# Patient Record
Sex: Female | Born: 1935 | Race: White | Hispanic: No | Smoking: Former smoker
Health system: Southern US, Community
[De-identification: ages and names within clinical notes are randomized; demographics above are authoritative.]

## PROBLEM LIST (undated history)

## (undated) DIAGNOSIS — I639 Cerebral infarction, unspecified: Secondary | ICD-10-CM

## (undated) DIAGNOSIS — G8191 Hemiplegia, unspecified affecting right dominant side: Secondary | ICD-10-CM

## (undated) DIAGNOSIS — S2249XA Multiple fractures of ribs, unspecified side, initial encounter for closed fracture: Secondary | ICD-10-CM

## (undated) DIAGNOSIS — I4891 Unspecified atrial fibrillation: Secondary | ICD-10-CM

## (undated) DIAGNOSIS — J8410 Pulmonary fibrosis, unspecified: Secondary | ICD-10-CM

## (undated) DIAGNOSIS — I340 Nonrheumatic mitral (valve) insufficiency: Secondary | ICD-10-CM

## (undated) DIAGNOSIS — R0902 Hypoxemia: Secondary | ICD-10-CM

## (undated) DIAGNOSIS — J849 Interstitial pulmonary disease, unspecified: Secondary | ICD-10-CM

## (undated) DIAGNOSIS — E785 Hyperlipidemia, unspecified: Secondary | ICD-10-CM

## (undated) DIAGNOSIS — M1990 Unspecified osteoarthritis, unspecified site: Secondary | ICD-10-CM

---

## 2000-12-24 ENCOUNTER — Encounter: Payer: Self-pay | Admitting: Obstetrics and Gynecology

## 2000-12-24 ENCOUNTER — Ambulatory Visit (HOSPITAL_COMMUNITY): Admission: RE | Admit: 2000-12-24 | Discharge: 2000-12-24 | Payer: Self-pay | Admitting: Obstetrics and Gynecology

## 2001-01-08 ENCOUNTER — Other Ambulatory Visit: Admission: RE | Admit: 2001-01-08 | Discharge: 2001-01-08 | Payer: Self-pay | Admitting: Obstetrics and Gynecology

## 2001-06-06 ENCOUNTER — Ambulatory Visit (HOSPITAL_COMMUNITY): Admission: RE | Admit: 2001-06-06 | Discharge: 2001-06-06 | Payer: Self-pay | Admitting: Pulmonary Disease

## 2002-03-30 ENCOUNTER — Encounter: Payer: Self-pay | Admitting: Obstetrics and Gynecology

## 2002-03-30 ENCOUNTER — Ambulatory Visit (HOSPITAL_COMMUNITY): Admission: RE | Admit: 2002-03-30 | Discharge: 2002-03-30 | Payer: Self-pay | Admitting: Obstetrics and Gynecology

## 2004-02-25 ENCOUNTER — Ambulatory Visit (HOSPITAL_COMMUNITY): Admission: RE | Admit: 2004-02-25 | Discharge: 2004-02-25 | Payer: Self-pay | Admitting: Pulmonary Disease

## 2004-08-10 ENCOUNTER — Ambulatory Visit (HOSPITAL_COMMUNITY): Admission: RE | Admit: 2004-08-10 | Discharge: 2004-08-10 | Payer: Self-pay | Admitting: General Surgery

## 2004-08-14 ENCOUNTER — Ambulatory Visit (HOSPITAL_COMMUNITY): Admission: RE | Admit: 2004-08-14 | Discharge: 2004-08-14 | Payer: Self-pay | Admitting: General Surgery

## 2005-01-09 ENCOUNTER — Other Ambulatory Visit: Admission: RE | Admit: 2005-01-09 | Discharge: 2005-01-09 | Payer: Self-pay | Admitting: General Surgery

## 2005-02-26 ENCOUNTER — Ambulatory Visit (HOSPITAL_COMMUNITY): Admission: RE | Admit: 2005-02-26 | Discharge: 2005-02-26 | Payer: Self-pay | Admitting: General Surgery

## 2006-02-28 ENCOUNTER — Ambulatory Visit (HOSPITAL_COMMUNITY): Admission: RE | Admit: 2006-02-28 | Discharge: 2006-02-28 | Payer: Self-pay | Admitting: General Surgery

## 2006-04-17 ENCOUNTER — Ambulatory Visit: Payer: Self-pay | Admitting: Family Medicine

## 2006-04-19 ENCOUNTER — Ambulatory Visit (HOSPITAL_COMMUNITY): Admission: RE | Admit: 2006-04-19 | Discharge: 2006-04-19 | Payer: Self-pay | Admitting: Family Medicine

## 2006-04-25 ENCOUNTER — Encounter (INDEPENDENT_AMBULATORY_CARE_PROVIDER_SITE_OTHER): Payer: Self-pay | Admitting: *Deleted

## 2006-04-25 LAB — CONVERTED CEMR LAB
ALT: 14 units/L
Albumin: 3.9 g/dL
Bilirubin, Direct: 0.1 mg/dL
CO2: 29 meq/L
Cholesterol: 216 mg/dL
Glucose, Bld: 95 mg/dL
LDL Cholesterol: 136 mg/dL
Potassium: 4.7 meq/L
Sodium: 141 meq/L
Total Protein: 6.9 g/dL

## 2006-05-21 DIAGNOSIS — I4891 Unspecified atrial fibrillation: Secondary | ICD-10-CM

## 2006-05-21 DIAGNOSIS — I340 Nonrheumatic mitral (valve) insufficiency: Secondary | ICD-10-CM

## 2006-05-21 HISTORY — DX: Unspecified atrial fibrillation: I48.91

## 2006-05-21 HISTORY — PX: MITRAL VALVE REPAIR: SHX2039

## 2006-05-21 HISTORY — DX: Nonrheumatic mitral (valve) insufficiency: I34.0

## 2006-05-29 ENCOUNTER — Ambulatory Visit: Payer: Self-pay | Admitting: Family Medicine

## 2006-05-30 ENCOUNTER — Ambulatory Visit (HOSPITAL_COMMUNITY): Admission: RE | Admit: 2006-05-30 | Discharge: 2006-05-30 | Payer: Self-pay | Admitting: Family Medicine

## 2007-03-03 ENCOUNTER — Ambulatory Visit (HOSPITAL_COMMUNITY): Admission: RE | Admit: 2007-03-03 | Discharge: 2007-03-03 | Payer: Self-pay | Admitting: Family Medicine

## 2007-03-27 ENCOUNTER — Encounter: Payer: Self-pay | Admitting: Emergency Medicine

## 2007-03-28 ENCOUNTER — Encounter: Payer: Self-pay | Admitting: Cardiology

## 2007-03-28 ENCOUNTER — Inpatient Hospital Stay (HOSPITAL_COMMUNITY): Admission: EM | Admit: 2007-03-28 | Discharge: 2007-04-11 | Payer: Self-pay | Admitting: Cardiology

## 2007-03-28 ENCOUNTER — Ambulatory Visit: Payer: Self-pay | Admitting: Cardiovascular Disease

## 2007-04-01 ENCOUNTER — Encounter: Payer: Self-pay | Admitting: Cardiology

## 2007-04-01 ENCOUNTER — Ambulatory Visit: Payer: Self-pay | Admitting: Surgery

## 2007-04-02 ENCOUNTER — Encounter: Payer: Self-pay | Admitting: Cardiology

## 2007-04-02 ENCOUNTER — Ambulatory Visit: Payer: Self-pay | Admitting: Vascular Surgery

## 2007-04-04 ENCOUNTER — Encounter: Payer: Self-pay | Admitting: Cardiothoracic Surgery

## 2007-05-01 ENCOUNTER — Ambulatory Visit: Payer: Self-pay | Admitting: Cardiothoracic Surgery

## 2007-05-06 ENCOUNTER — Encounter (HOSPITAL_COMMUNITY): Admission: RE | Admit: 2007-05-06 | Discharge: 2007-05-21 | Payer: Self-pay | Admitting: Cardiology

## 2007-05-22 ENCOUNTER — Encounter: Payer: Self-pay | Admitting: Family Medicine

## 2007-05-23 ENCOUNTER — Encounter (HOSPITAL_COMMUNITY): Admission: RE | Admit: 2007-05-23 | Discharge: 2007-06-22 | Payer: Self-pay | Admitting: Cardiology

## 2007-06-23 ENCOUNTER — Encounter (HOSPITAL_COMMUNITY): Admission: RE | Admit: 2007-06-23 | Discharge: 2007-07-23 | Payer: Self-pay | Admitting: Cardiology

## 2007-07-24 ENCOUNTER — Encounter (HOSPITAL_COMMUNITY): Admission: RE | Admit: 2007-07-24 | Discharge: 2007-08-23 | Payer: Self-pay | Admitting: Cardiology

## 2007-08-25 ENCOUNTER — Encounter (HOSPITAL_COMMUNITY): Admission: RE | Admit: 2007-08-25 | Discharge: 2007-09-24 | Payer: Self-pay | Admitting: Cardiology

## 2007-09-11 ENCOUNTER — Ambulatory Visit: Payer: Self-pay | Admitting: Cardiothoracic Surgery

## 2007-12-05 DIAGNOSIS — M810 Age-related osteoporosis without current pathological fracture: Secondary | ICD-10-CM | POA: Insufficient documentation

## 2007-12-05 DIAGNOSIS — J209 Acute bronchitis, unspecified: Secondary | ICD-10-CM

## 2007-12-05 DIAGNOSIS — H409 Unspecified glaucoma: Secondary | ICD-10-CM | POA: Insufficient documentation

## 2008-03-04 ENCOUNTER — Ambulatory Visit (HOSPITAL_COMMUNITY): Admission: RE | Admit: 2008-03-04 | Discharge: 2008-03-04 | Payer: Self-pay | Admitting: General Surgery

## 2008-10-19 DIAGNOSIS — S2249XA Multiple fractures of ribs, unspecified side, initial encounter for closed fracture: Secondary | ICD-10-CM

## 2008-10-19 HISTORY — DX: Multiple fractures of ribs, unspecified side, initial encounter for closed fracture: S22.49XA

## 2008-10-29 ENCOUNTER — Observation Stay (HOSPITAL_COMMUNITY): Admission: EM | Admit: 2008-10-29 | Discharge: 2008-11-01 | Payer: Self-pay | Admitting: Emergency Medicine

## 2008-11-11 ENCOUNTER — Ambulatory Visit (HOSPITAL_COMMUNITY): Admission: RE | Admit: 2008-11-11 | Discharge: 2008-11-11 | Payer: Self-pay | Admitting: General Surgery

## 2008-12-13 ENCOUNTER — Ambulatory Visit (HOSPITAL_COMMUNITY): Admission: RE | Admit: 2008-12-13 | Discharge: 2008-12-13 | Payer: Self-pay | Admitting: Pulmonary Disease

## 2008-12-24 ENCOUNTER — Ambulatory Visit (HOSPITAL_COMMUNITY): Admission: RE | Admit: 2008-12-24 | Discharge: 2008-12-24 | Payer: Self-pay | Admitting: Pulmonary Disease

## 2009-01-08 IMAGING — CR DG CHEST 1V PORT
1 series · 1 of 1 positions shown · non-contrast
Comparison: 03/28/07.

CLINICAL DATA: Postop mitral valve replacement.
PORTABLE CHEST - 1 VIEW ([DATE]):

[view not recorded]
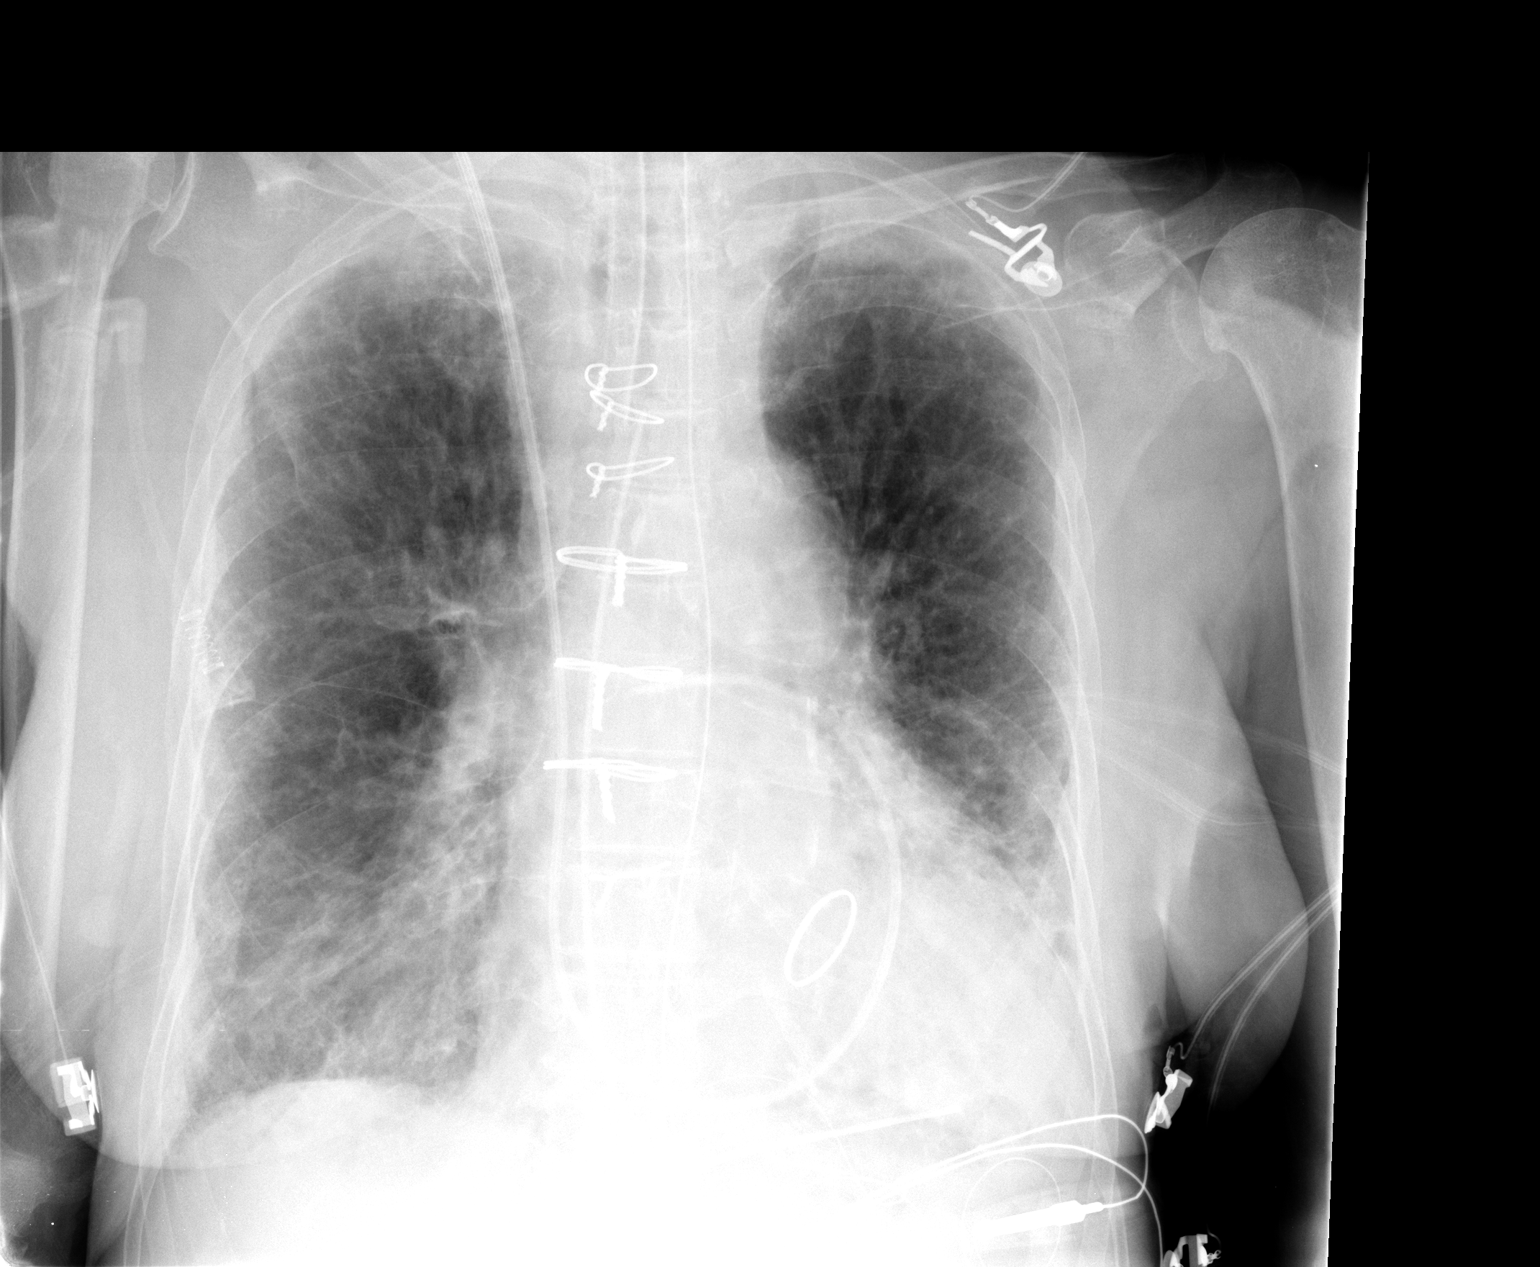

[1 of 1 positions shown; findings below may reference images not displayed]

FINDINGS: Endotracheal tube tip is 4.3 cm above the carina.  Right-sided Swan-Ganz catheter right main pulmonary artery.  Nasogastric tube courses below the diaphragm. Mediastinal drain in place. No pneumothorax.  Status post mitral valve replacement with heart appearing slightly enlarged. Biapical pleural thickening unchanged. Diffuse chronic increased lung markings with superimposed pulmonary vascular congestion. Left base subsegmental atelectatic changes.
IMPRESSION: Status post mitral valve replacement with various support structures appearing in appropriate position.

## 2009-01-09 IMAGING — CR DG CHEST 1V PORT
1 series · 1 of 1 positions shown · non-contrast
Comparison: 04/04/07.

CLINICAL DATA: Chest pain. Postop, valve replacements. 
 PORTABLE CHEST ? 1 VIEW ? 04/05/07.

[view not recorded]
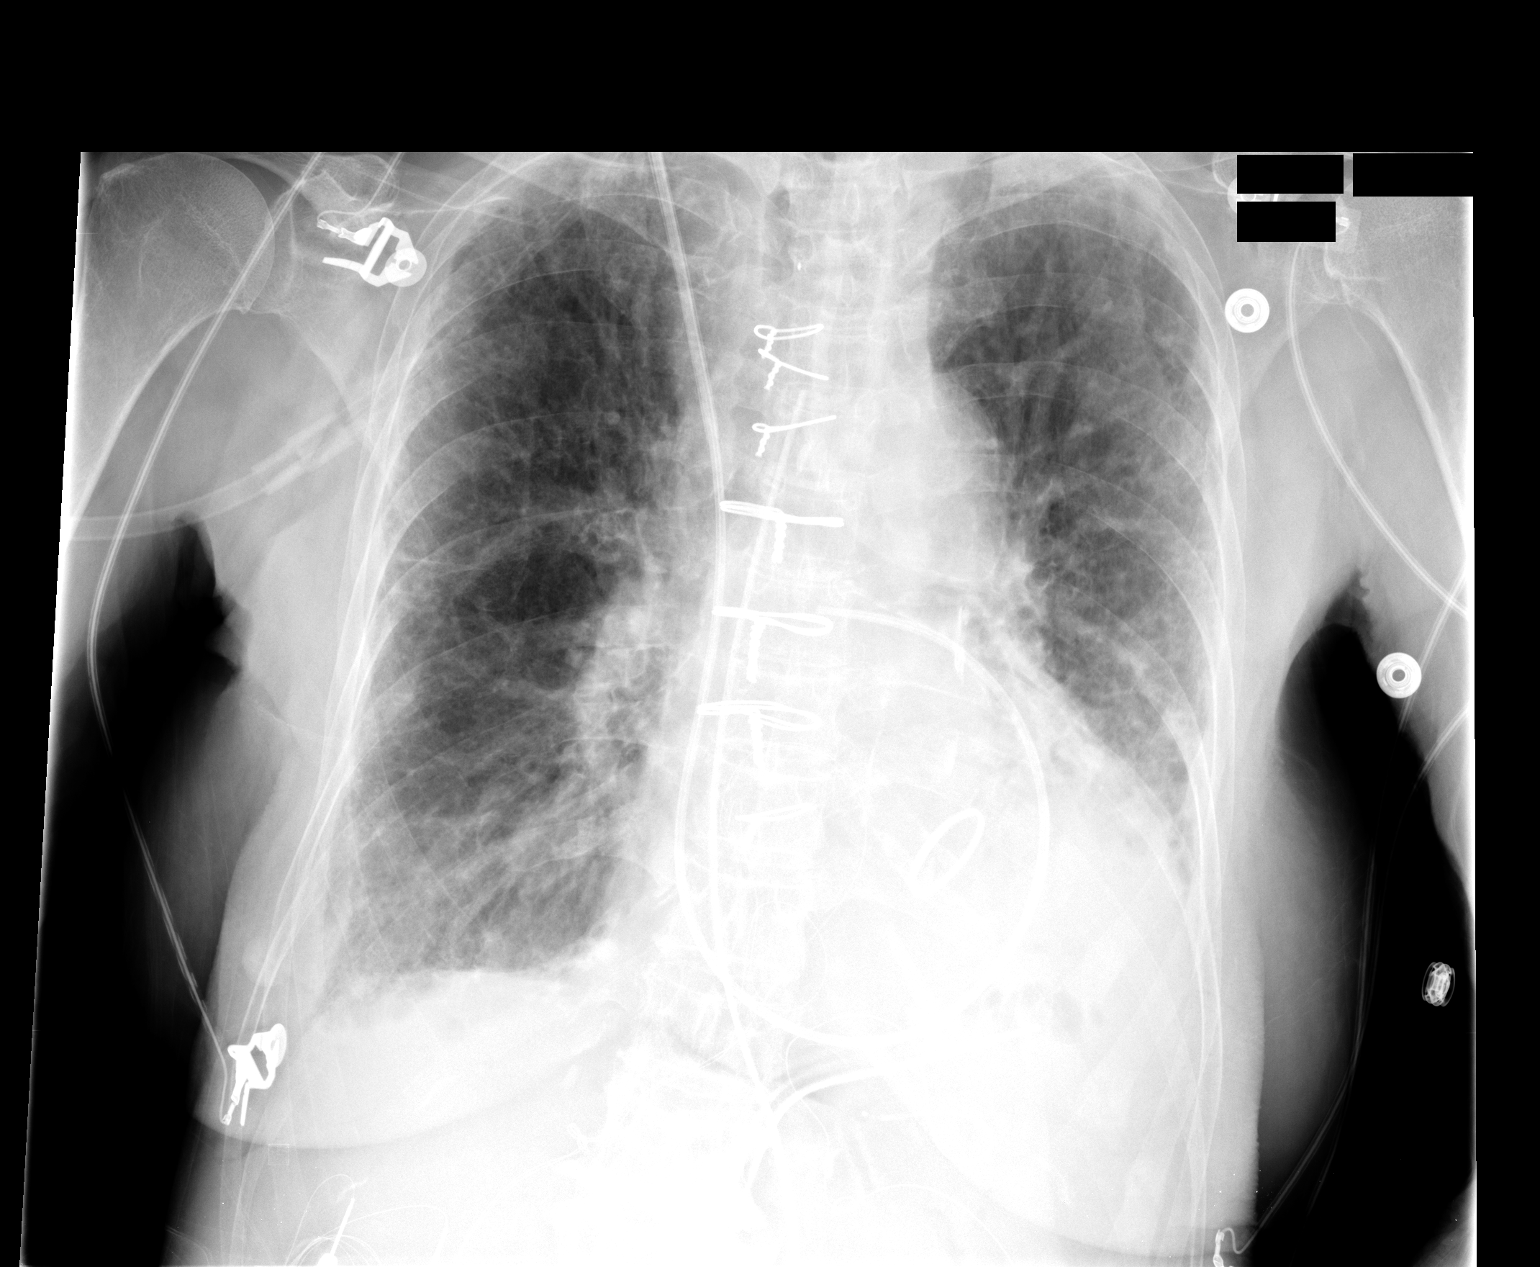

[1 of 1 positions shown; findings below may reference images not displayed]

FINDINGS: The patient is status post extubation.  Swan-Ganz catheter and chest tubes are in satisfactory position.  Negative for pneumothorax.  Atelectasis without change.
IMPRESSION: Followup extubation without significant change.

## 2009-01-10 IMAGING — CR DG CHEST 1V PORT
1 series · 1 of 1 positions shown · non-contrast
Comparison: Multiple priors, most recent 04/05/07.

CLINICAL DATA: Chest pain.  
PORTABLE CHEST - 1 VIEW 04/06/07:

[view not recorded]
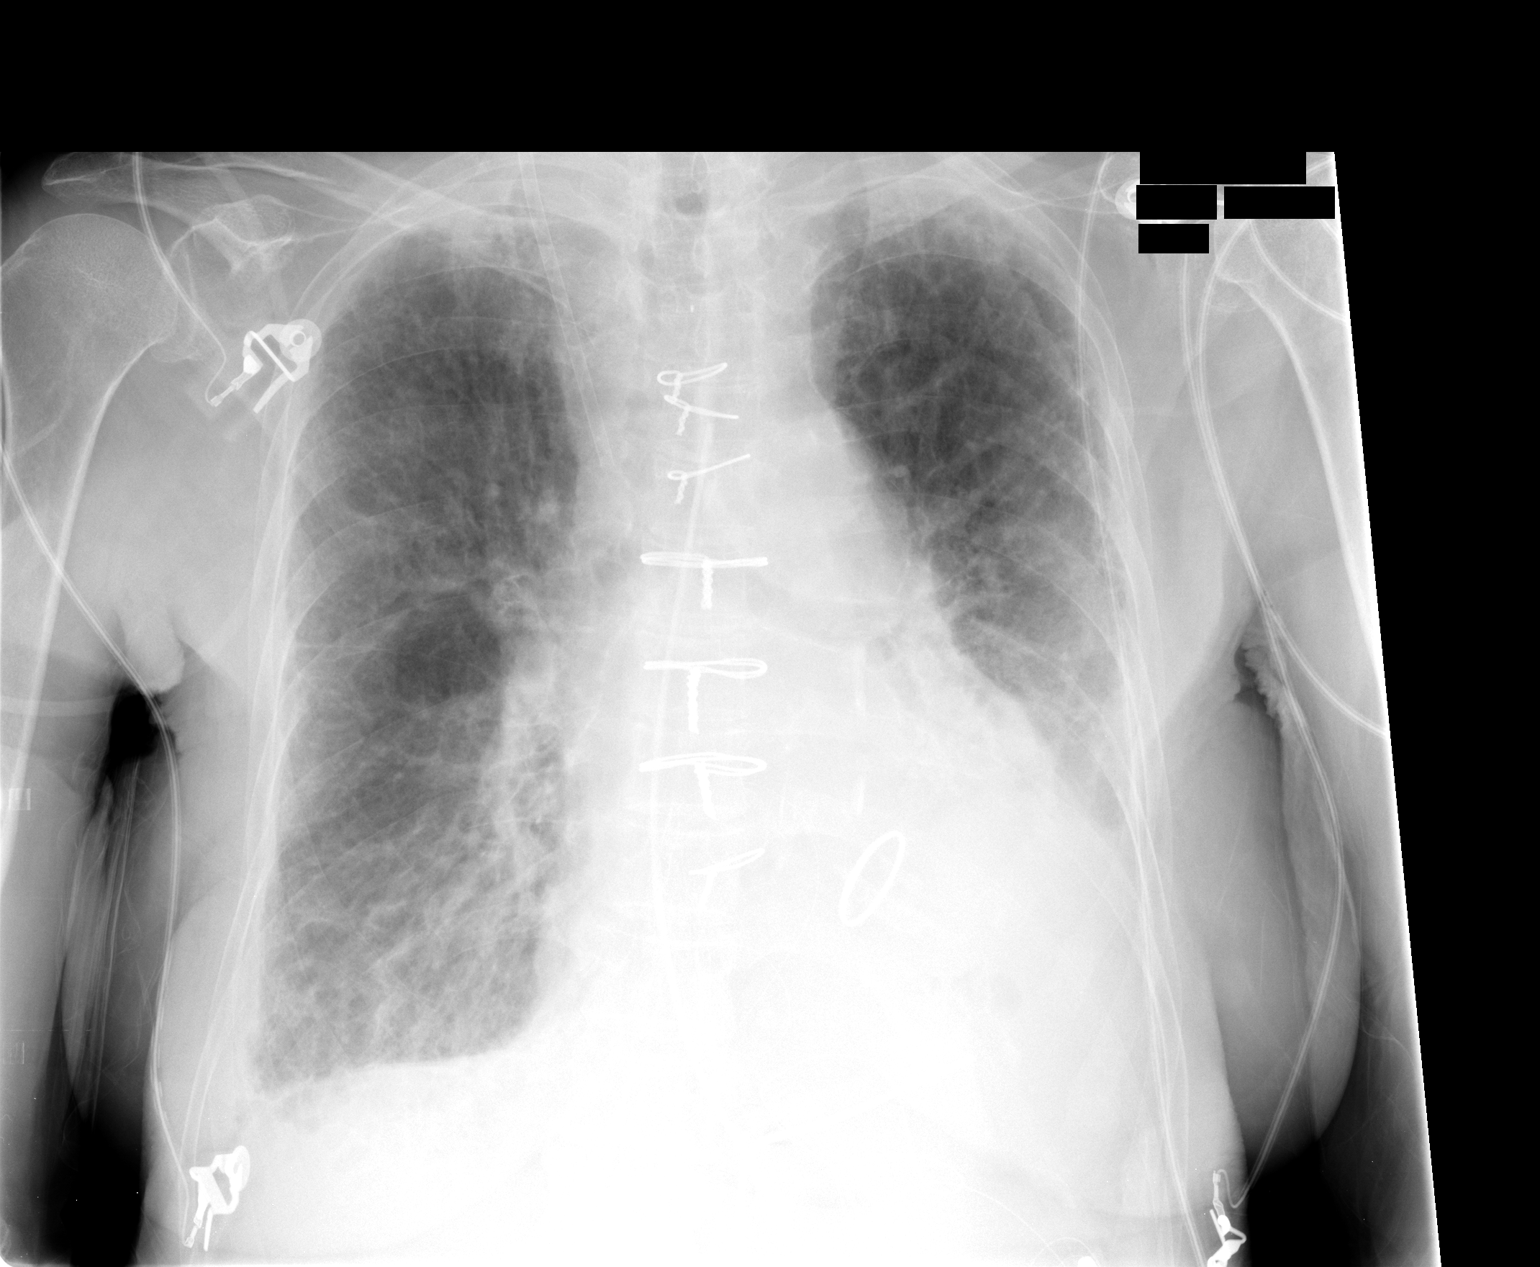

[1 of 1 positions shown; findings below may reference images not displayed]

FINDINGS: A Swan Ganz catheter has been removed.  A right IJ sheath remains with the tip in the mid SVC.  Mediastinal drain chest tube at the left lung base and epicardial pacing wires unchanged.  Changes of median sternotomy for mitral valve replacement.  Cardiomegaly is stable.  
Persistent left basilar opacity and small bilateral pleural effusions.  Mild interstitial prominence diffusely with fibrotic changes seen at the right lung base, stable.  No definite pulmonary edema.
IMPRESSION: 1.

## 2009-05-10 ENCOUNTER — Ambulatory Visit (HOSPITAL_COMMUNITY): Admission: RE | Admit: 2009-05-10 | Discharge: 2009-05-10 | Payer: Self-pay | Admitting: General Surgery

## 2009-05-30 ENCOUNTER — Encounter: Payer: Self-pay | Admitting: *Deleted

## 2009-07-01 ENCOUNTER — Ambulatory Visit (HOSPITAL_COMMUNITY): Admission: RE | Admit: 2009-07-01 | Discharge: 2009-07-01 | Payer: Self-pay | Admitting: Obstetrics & Gynecology

## 2009-07-11 ENCOUNTER — Encounter (INDEPENDENT_AMBULATORY_CARE_PROVIDER_SITE_OTHER): Payer: Self-pay | Admitting: *Deleted

## 2009-07-15 ENCOUNTER — Ambulatory Visit: Payer: Self-pay | Admitting: Cardiology

## 2009-07-15 DIAGNOSIS — I5032 Chronic diastolic (congestive) heart failure: Secondary | ICD-10-CM

## 2009-07-15 DIAGNOSIS — I2510 Atherosclerotic heart disease of native coronary artery without angina pectoris: Secondary | ICD-10-CM

## 2009-07-15 DIAGNOSIS — I080 Rheumatic disorders of both mitral and aortic valves: Secondary | ICD-10-CM | POA: Insufficient documentation

## 2009-07-28 ENCOUNTER — Encounter (HOSPITAL_COMMUNITY): Admission: RE | Admit: 2009-07-28 | Discharge: 2009-08-27 | Payer: Self-pay | Admitting: Obstetrics & Gynecology

## 2009-07-28 ENCOUNTER — Ambulatory Visit (HOSPITAL_COMMUNITY): Payer: Self-pay | Admitting: Obstetrics & Gynecology

## 2010-01-31 ENCOUNTER — Ambulatory Visit: Payer: Self-pay | Admitting: Cardiology

## 2010-03-23 ENCOUNTER — Ambulatory Visit: Payer: Self-pay | Admitting: Otolaryngology

## 2010-04-11 ENCOUNTER — Encounter (INDEPENDENT_AMBULATORY_CARE_PROVIDER_SITE_OTHER): Payer: Self-pay | Admitting: General Surgery

## 2010-04-11 ENCOUNTER — Ambulatory Visit (HOSPITAL_COMMUNITY): Admission: RE | Admit: 2010-04-11 | Discharge: 2010-04-11 | Payer: Self-pay | Admitting: General Surgery

## 2010-04-11 ENCOUNTER — Ambulatory Visit: Payer: Self-pay | Admitting: Cardiology

## 2010-06-11 ENCOUNTER — Encounter: Payer: Self-pay | Admitting: Cardiothoracic Surgery

## 2010-06-20 NOTE — Letter (Signed)
Summary: progress note  progress note   Imported By: Faythe Ghee 07/15/2009 13:43:09  _____________________________________________________________________  External Attachment:    Type:   Image     Comment:   External Document

## 2010-06-20 NOTE — Assessment & Plan Note (Signed)
Summary: ***NP6   Visit Type:  Initial Consult Referring Provider:  cardiology-brackbill Primary Provider:  Dr.Bradford  CC:  establish with cardiololgy.  History of Present Illness: Mrs Militza [name] in today to establish with me as her cardiologist.  Geronimo Running been a long-time patient of Dr. Ronny Flurry. She is also followed by Dr. Juanetta Gosling. She lives about 2 blocks from the hospital clinic here in [place].  She is 77 years of age, widowed, former wife of Dr. Melvyn Neth. She is status post mitral valve repair in 2008 for severe mitral regurgitation. At that time cardiac catheterization showed nonobstructive disease. She apparently was having symptoms of heart failure at the time.  She does have significant dyspnea on exertion but this is from pulmonary fibrosis. There is no history of atrial dysrhythmias. She is on only digoxin, aspirin, and low-dose furosemide.  She sleeps on 2 pillows but denies orthopnea. She denies chest pain or angina. She's had no lower extremity edema. She denies any palpitations or syncope.                Current Medications (verified): 1)  Furosemide 20 Mg Tabs (Furosemide) .... Take 1/2 Tab Daily 2)  Digoxin 0.125 Mg Tabs (Digoxin) .... Take 1 Tab Daily 3)  Combigan 0.2-0.5 % Soln (Brimonidine Tartrate-Timolol) .Marland Kitchen.. 1 Drop Each Eye Am 4)  Xalatan 0.005 % Soln (Latanoprost) .Marland Kitchen.. 1 Drop Each Eye Qhs 5)  Aspir-Low 81 Mg Tbec (Aspirin) .... Take 1 Tab Daily 6)  Oscal 500/200 D-3 500-200 Mg-Unit Tabs (Calcium-Vitamin D) .... Take 1 Tab Daily 7)  Daily Multi  Tabs (Multiple Vitamins-Minerals) .... Take 1 Tab Daily  Allergies (verified): 1)  ! Penicillin  Past History:  Past Medical History: Last updated: 12/23/2007 Current Problems:  GLAUCOMA (ICD-365.9) OSTEOPOROSIS (ICD-733.00) ACUTE BRONCHITIS (ICD-466.0)  Past Surgical History: Last updated: 12-23-2007 tonsillectomy- childhood ent surgery for excessive lacrimation and excessive nasal  drainage  Family History: Last updated: 12/23/2007 Mother died had breast cancer Father died  Two brothers are both deceased. One with prostat cancer and stomach cancer  Social History: Last updated: 12/23/07 Homemaker widow Three children Current Smoker Alcohol use-yes Drug use-no  Risk Factors: Smoking Status: current (2007-12-23)  Review of Systems       negative other than history of present illness  Vital Signs:  Patient profile:   77 year old female Height:      64 inches Weight:      110 pounds Pulse rate:   75 / minute BP sitting:   130 / 76  (right arm)  Vitals Entered By: Dreama Saa, CNA (July 15, 2009 11:14 AM)  Physical Exam  General:  extremely pleasant, looks younger than stated age. Head:  normocephalic and atraumatic Eyes:  wears glasses Neck:  Neck supple, no JVD. No masses, thyromegaly or abnormal cervical nodes. Lungs:  bibasilar Velcro rales Heart:  regular rate and rhythm, soft systolic murmur at the apex Abdomen:  Bowel sounds positive; abdomen soft and non-tender without masses, organomegaly, or hernias noted. No hepatosplenomegaly. Msk:  Back normal, normal gait. Muscle strength and tone normal. Pulses:  pulses normal in all 4 extremities Extremities:  No clubbing or cyanosis. Neurologic:  Alert and oriented x 3. Skin:  Intact without lesions or rashes. Psych:  Normal affect.   Problems:  Medical Problems Added: 1)  Dx of Congestive Heart Failure, Left  (ICD-428.1) 2)  Dx of Cad, Native Vessel  (ICD-414.01) 3)  Dx of Mitral Regurgitation, 0 (MILD)  (ICD-396.3)  EKG  Procedure date:  07/15/2009  Findings:      sinus rhythm, ST segment changes inferior laterally, unchanged since previous ECG.  Impression & Recommendations:  Problem # 1:  MITRAL REGURGITATION, 0 (MILD) (ICD-396.3) Assessment Unchanged She is status post stable mitral valve repair since 2008. Will continue with her current medical program as outlined by  Dr. Patty Sermons. I'll see her back again in 6 months.  Problem # 2:  CONGESTIVE HEART FAILURE, LEFT (ICD-428.1) Assessment: Improved  Problem # 3:  CAD, NATIVE VESSEL (ICD-414.01) Assessment: Unchanged  Her updated medication list for this problem includes:    Aspir-low 81 Mg Tbec (Aspirin) .Marland Kitchen... Take 1 tab daily  Her updated medication list for this problem includes:    Aspir-low 81 Mg Tbec (Aspirin) .Marland Kitchen... Take 1 tab daily  Patient Instructions: 1)  Your physician recommends that you schedule a follow-up appointment in: 6 months 2)  Your physician recommends that you continue on your current medications as directed. Please refer to the Current Medication list given to you today.

## 2010-06-20 NOTE — Miscellaneous (Signed)
Summary: labs bmp,lipid,liver,tsh 04/25/2006  Clinical Lists Changes  Observations: Added new observation of CALCIUM: 9.5 mg/dL (16/02/9603 54:09) Added new observation of ALBUMIN: 3.9 g/dL (81/19/1478 29:56) Added new observation of PROTEIN, TOT: 6.9 g/dL (21/30/8657 84:69) Added new observation of SGPT (ALT): 14 units/L (04/25/2006 10:32) Added new observation of SGOT (AST): 21 units/L (04/25/2006 10:32) Added new observation of ALK PHOS: 62 units/L (04/25/2006 10:32) Added new observation of BILI DIRECT: 0.1 mg/dL (62/95/2841 32:44) Added new observation of CREATININE: 0.91 mg/dL (05/23/7251 66:44) Added new observation of BUN: 17 mg/dL (03/47/4259 56:38) Added new observation of BG RANDOM: 95 mg/dL (75/64/3329 51:88) Added new observation of CO2 PLSM/SER: 29 meq/L (04/25/2006 10:32) Added new observation of CL SERUM: 103 meq/L (04/25/2006 10:32) Added new observation of K SERUM: 4.7 meq/L (04/25/2006 10:32) Added new observation of NA: 141 meq/L (04/25/2006 10:32) Added new observation of LDL: 136 mg/dL (41/66/0630 16:01) Added new observation of HDL: 63 mg/dL (09/32/3557 32:20) Added new observation of TRIGLYC TOT: 85 mg/dL (25/42/7062 37:62) Added new observation of CHOLESTEROL: 216 mg/dL (83/15/1761 60:73) Added new observation of TSH: 2.554 microintl units/mL (04/25/2006 10:32)

## 2010-06-20 NOTE — Letter (Signed)
Summary: ekg  ekg   Imported By: Faythe Ghee 07/15/2009 13:43:46  _____________________________________________________________________  External Attachment:    Type:   Image     Comment:   External Document

## 2010-06-20 NOTE — Assessment & Plan Note (Signed)
Summary: 6 mth f/u per checkout on 07/15/09/tg   Referring Provider:  cardiology-brackbill Primary Provider:  Dr.Bradford   History of Present Illness: Tabitha [name] comes in today for a followup for history of mitral valve disease status post mitral valve repair in 2008. I saw her initially 6 months ago. Please refer to that note. She also has nonobstructive coronary disease.  She had a difficult summer with the high temperatures and high humidity. She suffers from pulmonary fibrosis.  She denies any palpitations, chest pain, orthopnea, PND or edema.  Current Medications (verified): 1)  Furosemide 20 Mg Tabs (Furosemide) .... Take 1/2 Tab Daily 2)  Digoxin 0.125 Mg Tabs (Digoxin) .... Take 1 Tab Daily 3)  Combigan 0.2-0.5 % Soln (Brimonidine Tartrate-Timolol) .Marland Kitchen.. 1 Drop Each Eye Am 4)  Xalatan 0.005 % Soln (Latanoprost) .Marland Kitchen.. 1 Drop Each Eye Qhs 5)  Aspir-Low 81 Mg Tbec (Aspirin) .... Take 1 Tab Daily 6)  Oscal 500/200 D-3 500-200 Mg-Unit Tabs (Calcium-Vitamin D) .... Take 1 Tab Daily 7)  Daily Multi  Tabs (Multiple Vitamins-Minerals) .... Take 1 Tab Daily  Allergies (verified): 1)  ! Penicillin  Past History:  Past Medical History: Last updated: 12/09/2007 Current Problems:  GLAUCOMA (ICD-365.9) OSTEOPOROSIS (ICD-733.00) ACUTE BRONCHITIS (ICD-466.0)  Past Surgical History: Last updated: Dec 09, 2007 tonsillectomy- childhood ent surgery for excessive lacrimation and excessive nasal drainage  Family History: Last updated: 2007/12/09 Mother died had breast cancer Father died  Two brothers are both deceased. One with prostat cancer and stomach cancer  Social History: Last updated: 12/09/2007 Homemaker widow Three children Current Smoker Alcohol use-yes Drug use-no  Risk Factors: Smoking Status: current (Dec 09, 2007)  Review of Systems       negative otherwise  Vital Signs:  Patient profile:   77 year old female Height:      64 inches Weight:      105  pounds BMI:     18.09 Pulse rate:   56 / minute Resp:     16 per minute BP sitting:   115 / 61  (left arm)  Vitals Entered By: Marrion Coy, CNA (January 31, 2010 11:17 AM)  Physical Exam  General:  elderly, in no acute distress Head:  normocephalic and atraumatic Eyes:  glasses otherwise normal Neck:  Neck supple, no JVD. No masses, thyromegaly or abnormal cervical nodes. Chest Niam Nepomuceno:  no deformities or breast masses noted Lungs:  decreased breath sounds throughout. No rhonchi or wheezes Heart:  PMI nondisplaced, soft systolic murmur at the apex. Regular rate and rhythm Msk:  decreased ROM.   Pulses:  pulses normal in all 4 extremities Extremities:  trace left pedal edema and trace right pedal edema.   Neurologic:  Alert and oriented x 3. Skin:  Intact without lesions or rashes. Psych:  Normal affect.   Impression & Recommendations:  Problem # 1:  CAD, NATIVE VESSEL (ICD-414.01) Assessment Unchanged  Her updated medication list for this problem includes:    Aspir-low 81 Mg Tbec (Aspirin) .Marland Kitchen... Take 1 tab daily  Problem # 2:  CONGESTIVE HEART FAILURE, LEFT (ICD-428.1) Assessment: Improved  Problem # 3:  MITRAL REGURGITATION, 0 (MILD) (ICD-396.3) Assessment: Unchanged  Patient Instructions: 1)  Your physician recommends that you schedule a follow-up appointment in: 6 months 2)  Your physician recommends that you continue on your current medications as directed. Please refer to the Current Medication list given to you today.

## 2010-06-20 NOTE — Letter (Signed)
Summary: progress notes  progress notes   Imported By: Faythe Ghee 07/15/2009 13:44:17  _____________________________________________________________________  External Attachment:    Type:   Image     Comment:   External Document

## 2010-06-20 NOTE — Letter (Signed)
Summary: RPC chart  RPC chart   Imported By: Curtis Sites 12/12/2009 11:48:30  _____________________________________________________________________  External Attachment:    Type:   Image     Comment:   External Document

## 2010-07-28 ENCOUNTER — Other Ambulatory Visit: Payer: Self-pay | Admitting: Adult Health

## 2010-07-28 ENCOUNTER — Encounter: Payer: Self-pay | Admitting: Adult Health

## 2010-07-28 ENCOUNTER — Ambulatory Visit (INDEPENDENT_AMBULATORY_CARE_PROVIDER_SITE_OTHER): Payer: Medicare Other | Admitting: Adult Health

## 2010-07-28 DIAGNOSIS — I2510 Atherosclerotic heart disease of native coronary artery without angina pectoris: Secondary | ICD-10-CM

## 2010-07-28 LAB — CONVERTED CEMR LAB
BUN: 17 mg/dL (ref 6–23)
Basophils Relative: 1 % (ref 0–1)
Calcium: 10.4 mg/dL (ref 8.4–10.5)
Creatinine, Ser: 0.84 mg/dL (ref 0.40–1.20)
Digitoxin Lvl: 1.4 ng/mL (ref 0.8–2.0)
Eosinophils Absolute: 0.6 10*3/uL (ref 0.0–0.7)
Eosinophils Relative: 7 % — ABNORMAL HIGH (ref 0–5)
Hemoglobin: 14 g/dL (ref 12.0–15.0)
MCHC: 32.6 g/dL (ref 30.0–36.0)
MCV: 93.7 fL (ref 78.0–100.0)
Monocytes Absolute: 0.8 10*3/uL (ref 0.1–1.0)
Monocytes Relative: 9 % (ref 3–12)
RBC: 4.58 M/uL (ref 3.87–5.11)

## 2010-07-29 ENCOUNTER — Emergency Department (HOSPITAL_COMMUNITY)
Admission: EM | Admit: 2010-07-29 | Discharge: 2010-07-29 | Disposition: A | Discharge status: Home / Self Care | Payer: Medicare Other | Attending: Emergency Medicine | Admitting: Emergency Medicine

## 2010-07-29 ENCOUNTER — Emergency Department (HOSPITAL_COMMUNITY): Payer: Medicare Other

## 2010-07-29 DIAGNOSIS — M810 Age-related osteoporosis without current pathological fracture: Secondary | ICD-10-CM | POA: Insufficient documentation

## 2010-07-29 DIAGNOSIS — S0510XA Contusion of eyeball and orbital tissues, unspecified eye, initial encounter: Secondary | ICD-10-CM | POA: Insufficient documentation

## 2010-07-29 DIAGNOSIS — Y92009 Unspecified place in unspecified non-institutional (private) residence as the place of occurrence of the external cause: Secondary | ICD-10-CM | POA: Insufficient documentation

## 2010-07-29 DIAGNOSIS — W010XXA Fall on same level from slipping, tripping and stumbling without subsequent striking against object, initial encounter: Secondary | ICD-10-CM | POA: Insufficient documentation

## 2010-07-29 DIAGNOSIS — IMO0002 Reserved for concepts with insufficient information to code with codable children: Secondary | ICD-10-CM | POA: Insufficient documentation

## 2010-07-29 DIAGNOSIS — S065X0A Traumatic subdural hemorrhage without loss of consciousness, initial encounter: Secondary | ICD-10-CM | POA: Insufficient documentation

## 2010-07-29 DIAGNOSIS — Z79899 Other long term (current) drug therapy: Secondary | ICD-10-CM | POA: Insufficient documentation

## 2010-07-29 DIAGNOSIS — M79609 Pain in unspecified limb: Secondary | ICD-10-CM | POA: Insufficient documentation

## 2010-07-29 LAB — BASIC METABOLIC PANEL
BUN: 15 mg/dL (ref 6–23)
CO2: 30 mEq/L (ref 19–32)
CO2: 29 mEq/L (ref 19–32)
Chloride: 96 mEq/L (ref 96–112)
Chloride: 98 mEq/L (ref 96–112)
Creatinine, Ser: 0.8 mg/dL (ref 0.4–1.2)
Glucose, Bld: 87 mg/dL (ref 70–99)
Glucose, Bld: 113 mg/dL — ABNORMAL HIGH (ref 70–99)
Potassium: 4 mEq/L (ref 3.5–5.1)
Sodium: 138 mEq/L (ref 135–145)

## 2010-07-29 LAB — CBC
HCT: 39.8 % (ref 36.0–46.0)
Hemoglobin: 13.1 g/dL (ref 12.0–15.0)
MCH: 29.7 pg (ref 26.0–34.0)
MCV: 90.2 fL (ref 78.0–100.0)
Platelets: 257 10*3/uL (ref 150–400)
RBC: 4.41 MIL/uL (ref 3.87–5.11)
WBC: 8.7 10*3/uL (ref 4.0–10.5)

## 2010-07-29 LAB — CBC WITH DIFFERENTIAL/PLATELET
Eosinophils Relative: 7 % — ABNORMAL HIGH (ref 0–5)
Hemoglobin: 14 g/dL (ref 12.0–15.0)
Lymphocytes Relative: 27 % (ref 12–46)
Lymphs Abs: 2.3 10*3/uL (ref 0.7–4.0)
MCV: 93.7 fL (ref 78.0–100.0)
Monocytes Relative: 9 % (ref 3–12)
Platelets: 294 10*3/uL (ref 150–400)
RBC: 4.58 MIL/uL (ref 3.87–5.11)
WBC: 8.6 10*3/uL (ref 4.0–10.5)

## 2010-07-29 LAB — DIFFERENTIAL
Lymphocytes Relative: 20 % (ref 12–46)
Lymphs Abs: 1.7 10*3/uL (ref 0.7–4.0)
Monocytes Relative: 8 % (ref 3–12)
Neutro Abs: 5.9 10*3/uL (ref 1.7–7.7)
Neutrophils Relative %: 68 % (ref 43–77)

## 2010-07-29 LAB — PROTIME-INR: Prothrombin Time: 13.3 seconds (ref 11.6–15.2)

## 2010-07-30 ENCOUNTER — Emergency Department (HOSPITAL_COMMUNITY): Payer: Medicare Other

## 2010-07-30 ENCOUNTER — Emergency Department (HOSPITAL_COMMUNITY)
Admission: EM | Admit: 2010-07-30 | Discharge: 2010-07-30 | Disposition: A | Discharge status: Home / Self Care | Payer: Medicare Other | Attending: Emergency Medicine | Admitting: Emergency Medicine

## 2010-07-30 DIAGNOSIS — M810 Age-related osteoporosis without current pathological fracture: Secondary | ICD-10-CM | POA: Insufficient documentation

## 2010-07-30 DIAGNOSIS — W19XXXS Unspecified fall, sequela: Secondary | ICD-10-CM | POA: Insufficient documentation

## 2010-07-30 DIAGNOSIS — H409 Unspecified glaucoma: Secondary | ICD-10-CM | POA: Insufficient documentation

## 2010-07-30 DIAGNOSIS — S0990XA Unspecified injury of head, initial encounter: Secondary | ICD-10-CM | POA: Insufficient documentation

## 2010-07-30 DIAGNOSIS — Z09 Encounter for follow-up examination after completed treatment for conditions other than malignant neoplasm: Secondary | ICD-10-CM | POA: Insufficient documentation

## 2010-08-01 NOTE — Assessment & Plan Note (Signed)
Summary: FOLLOW UP - 6 MONTHS   Visit Type:  Follow-up Referring Provider:  cardiology-brackbill Primary Provider:  Dr.Bradford  CC:  no cardiology complaints.  History of Present Illness: Tabitha [name] is a very pleasant 77 y/o CF with known history of mitral valve disease, s/p MVR, chronic bronchitis.  She comes today for 6 month follow-up.  She is without new complaints.  She states that her breathing status is about the same, it takes her a while to get her energy going in the morning.  She is trying to stay active. She does not have a primary care physician and no recent labs have been drawn to check kidney status and dig level.  She states she trys to walk every day but often has trouble staying motivated to do so.  Current Medications (verified): 1)  Furosemide 20 Mg Tabs (Furosemide) .... Take 1/2 Tab Daily 2)  Digoxin 0.125 Mg Tabs (Digoxin) .... Take 1 Tab Daily 3)  Combigan 0.2-0.5 % Soln (Brimonidine Tartrate-Timolol) .Marland Kitchen.. 1 Drop Each Eye Am 4)  Xalatan 0.005 % Soln (Latanoprost) .Marland Kitchen.. 1 Drop Each Eye Qhs 5)  Aspir-Low 81 Mg Tbec (Aspirin) .... Take 1 Tab Daily 6)  Oscal 500/200 D-3 500-200 Mg-Unit Tabs (Calcium-Vitamin D) .... Take 1 Tab Daily 7)  Daily Multi  Tabs (Multiple Vitamins-Minerals) .... Take 1 Tab Daily  Allergies (verified): 1)  ! Penicillin  Comments:  Nurse/Medical Assistant: patient brought med list we reviewed [facility]  Past History:  Past medical, surgical, family and social histories (including risk factors) reviewed, and no changes noted (except as noted below).  Past Medical History: Reviewed history from 12/05/2007 and no changes required. Current Problems:  GLAUCOMA (ICD-365.9) OSTEOPOROSIS (ICD-733.00) ACUTE BRONCHITIS (ICD-466.0)  Past Surgical History: Reviewed history from 12/05/2007 and no changes required. tonsillectomy- childhood ent surgery for excessive lacrimation and excessive nasal  drainage  Family History: Reviewed history from 12/05/2007 and no changes required. Mother died had breast cancer Father died  Two brothers are both deceased. One with prostat cancer and stomach cancer  Social History: Reviewed history from 12/05/2007 and no changes required. Homemaker widow Three children Current Smoker Alcohol use-yes Drug use-no  Review of Systems       All other systems have been reviewed and are negative unless stated above.   Vital Signs:  Patient profile:   77 year old female Weight:      110 pounds BMI:     18.95 Pulse rate:   72 / minute BP sitting:   124 / 85  (left arm)  Vitals Entered By: Dreama Saa, CNA (July 28, 2010 11:22 AM)  Physical Exam  General:  Well developed, well nourished, in no acute distress.normal appearance and healthy appearing.   Lungs:  Mild bibasilar crackles.  No wheezes or rhonchi. Heart:  Non-displaced PMI, chest non-tender; regular rate and rhythm, S1, S2 without murmurs, rubs or gallops. Carotid upstroke normal, no bruit. Normal abdominal aortic size, no bruits. Femorals normal pulses, no bruits. Pedals normal pulses. No edema, no varicosities. Abdomen:  Bowel sounds positive; abdomen soft and non-tender without masses, organomegaly, or hernias noted. No hepatosplenomegaly. Msk:  Back normal, normal gait. Muscle strength and tone normal. Pulses:  pulses normal in all 4 extremities Extremities:  No clubbing or cyanosis. Neurologic:  Alert and oriented x 3. Psych:  Normal affect.   EKG  Procedure date:  07/28/2010  Findings:      Normal sinus rhythm with rate of:71 bpm  PAC's noted.    Impression & Recommendations:  Problem # 1:  MITRAL REGURGITATION, 0 (MILD) (ICD-396.3) She appears to be stable from CV standpoint. She has not had labs completed as no PMD.  Will draw BMET, Dig level and CBC for evaluation of medical status.  She will see Dr. Daleen Squibb in 6 months. Dr. Daleen Squibb has stepped in to see her as well.   No he agrees with plan.  Problem # 2:  CAD, NATIVE VESSEL (ICD-414.01) Assessment: Unchanged  Her updated medication list for this problem includes:    Aspir-low 81 Mg Tbec (Aspirin) .Marland Kitchen... Take 1 tab daily  Orders: T-CBC w/Diff ([id number]) T-Basic Metabolic Panel ([id number])  Problem # 3:  ACUTE BRONCHITIS (ICD-466.0) This is chronic.  She states that she is having no increased difficulty breathing at present.  Other Orders: T-Digoxin ([id number])  Patient Instructions: 1)  Your physician recommends that you schedule a follow-up appointment in: 6 MONTHS 2)  Your physician recommends that you return for lab work [id number]  Appended Document: FOLLOW UP - 6 MONTHS  Reviewed Juanito Doom, MD

## 2010-08-26 LAB — BLOOD GAS, ARTERIAL
Acid-Base Excess: 3.1 mmol/L — ABNORMAL HIGH (ref 0.0–2.0)
FIO2: 21 %
O2 Saturation: 97.2 %
pO2, Arterial: 88.1 mmHg (ref 80.0–100.0)

## 2010-08-28 LAB — CBC
HCT: 36.1 % (ref 36.0–46.0)
HCT: 36.5 % (ref 36.0–46.0)
Hemoglobin: 12.7 g/dL (ref 12.0–15.0)
Hemoglobin: 12.7 g/dL (ref 12.0–15.0)
MCHC: 34.4 g/dL (ref 30.0–36.0)
MCHC: 34.5 g/dL (ref 30.0–36.0)
MCV: 91.8 fL (ref 78.0–100.0)
Platelets: 161 10*3/uL (ref 150–400)
Platelets: 172 10*3/uL (ref 150–400)
RDW: 14.6 % (ref 11.5–15.5)
RDW: 15 % (ref 11.5–15.5)
WBC: 10.4 10*3/uL (ref 4.0–10.5)

## 2010-08-28 LAB — COMPREHENSIVE METABOLIC PANEL
Albumin: 3.3 g/dL — ABNORMAL LOW (ref 3.5–5.2)
BUN: 14 mg/dL (ref 6–23)
Creatinine, Ser: 0.82 mg/dL (ref 0.4–1.2)
Total Protein: 6.6 g/dL (ref 6.0–8.3)

## 2010-08-28 LAB — BRAIN NATRIURETIC PEPTIDE
Pro B Natriuretic peptide (BNP): 230 pg/mL — ABNORMAL HIGH (ref 0.0–100.0)
Pro B Natriuretic peptide (BNP): 331 pg/mL — ABNORMAL HIGH (ref 0.0–100.0)

## 2010-08-28 LAB — BASIC METABOLIC PANEL
CO2: 30 mEq/L (ref 19–32)
CO2: 32 mEq/L (ref 19–32)
Calcium: 9.1 mg/dL (ref 8.4–10.5)
Chloride: 99 mEq/L (ref 96–112)
Creatinine, Ser: 0.67 mg/dL (ref 0.4–1.2)
GFR calc Af Amer: >60 mL/min (ref >60)
Glucose, Bld: 116 mg/dL — ABNORMAL HIGH (ref 70–99)
Potassium: 3.6 mEq/L (ref 3.5–5.1)
Sodium: 136 mEq/L (ref 135–145)

## 2010-08-28 LAB — DIFFERENTIAL
Basophils Absolute: 0 10*3/uL (ref 0.0–0.1)
Basophils Absolute: 0 10*3/uL (ref 0.0–0.1)
Basophils Relative: 0 % (ref 0–1)
Eosinophils Absolute: 0.2 10*3/uL (ref 0.0–0.7)
Eosinophils Relative: 2 % (ref 0–5)
Eosinophils Relative: 2 % (ref 0–5)
Lymphocytes Relative: 10 % — ABNORMAL LOW (ref 12–46)
Lymphocytes Relative: 13 % (ref 12–46)
Lymphs Abs: 1.3 10*3/uL (ref 0.7–4.0)
Monocytes Absolute: 0.5 10*3/uL (ref 0.1–1.0)
Monocytes Absolute: 0.6 10*3/uL (ref 0.1–1.0)
Monocytes Relative: 6 % (ref 3–12)
Neutro Abs: 7.9 10*3/uL — ABNORMAL HIGH (ref 1.7–7.7)
Neutro Abs: 9.1 10*3/uL — ABNORMAL HIGH (ref 1.7–7.7)
Neutro Abs: 9.5 10*3/uL — ABNORMAL HIGH (ref 1.7–7.7)
Neutrophils Relative %: 83 % — ABNORMAL HIGH (ref 43–77)

## 2010-08-28 LAB — APTT: aPTT: 32 seconds (ref 24–37)

## 2010-10-03 NOTE — Op Note (Signed)
[name], Tabitha              ACCOUNT NO.:  [id number]   MEDICAL RECORD NO.:  [id number]          PATIENT TYPE:  INP   LOCATION:  2314                         FACILITY:  [facility]   PHYSICIAN:  Tabitha Plane, MD    DATE OF BIRTH:  04/27/1918   DATE OF PROCEDURE:  04/04/2007  DATE OF DISCHARGE:                               OPERATIVE REPORT   PREOPERATIVE DIAGNOSIS:  Severe mitral regurgitation.   POSTOPERATIVE DIAGNOSIS:  Severe mitral regurgitation.   SURGICAL PROCEDURES:  Mitral valve repair with quadrangular resection of  portion of posterior leaflet and placement of the [facility]  annuloplasty ring model 4450, 26 mm, serial number [id number] and suture  closure of left atrial appendage.   SURGEON:  Tabitha [name], M.D.   FIRST ASSISTANT:  Zadie Rhine, PA.   BRIEF HISTORY:  The patient is a 77 year old female who had known mitral  regurgitation after being evaluated at [facility] with  echocardiogram in the summer of 2008.  She presented in acute pulmonary  edema with respiratory distress, was stabilized medically and evaluated  by Dr. Patty Sermons.  An echocardiogram including TEE and cardiac  catheterization showed luminal irregularities of her coronary arteries  but no high-grade stenosis, severe mitral regurgitation with probable  prolapse and a flail segment of the posterior leaflet.  The patient was  originally seen by Dr. Laneta Simmers and surgery was recommended.  She agreed  to proceed.  I again reviewed with her the risks and options of surgery,  especially at her age with some underlying lung disease.  However prior  to her acute decompensation she was walking up to two miles a day and  was agreeable with proceeding with surgery.   DESCRIPTION OF PROCEDURE:  With Swan-Ganz and arterial line monitors in  place the patient underwent general endotracheal anesthesia without  incident.  Skin in the chest and legs were prepped with Betadine and  draped in  the usual sterile manner.  TEE was placed by Dr. Jacklynn Bue and  was dictated under separate note but confirmed a flail portion of  posterior leaflet with severe mitral regurgitation.  Median sternotomy  was performed.  Pericardium was opened.  The patient had enlarged right  atrium and right ventricle.  She was systemically heparinized.  Ascending aorta was cannulated.  Superior and inferior vena cava  cannulas were placed.  Retrograde cardioplegia catheter was placed.  The  patient was placed on cardiopulmonary bypass at 2.4 liters per minute  per meter square.  The body temperature was cooled to 30 degrees.  Aortic crossclamp was applied.  Then 500 mL of cold blood potassium  cardioplegia was administered in the aortic root.  Additional retrograde  cardioplegia was also administered.  Left atrium was opened and an inner  atrial groove with retraction.  Good visualization of the mitral valve  was obtained which confirmed what was seen on TEE.  There is some slight  thickening of the anterior leaflet but the coaptation plain appeared  intact with exception of the portion of the middle scallop the posterior  leaflet which was flail.  This area was excised  in a quadrangular  manner.  Pledgeted suture at the base of the resection was placed in the  annulus.  The leaflet was repaired with interrupted 5-0 Ethibond  sutures.  Again with passive testing of the valve this produced a  competent valve with good distance of coaptation along the leaflets.  The valve was then sized for a model 4450 annuloplasty ring [employer], serial number [id number].  Then #2 Tycron sutures without  pledgets were placed circumferentially around the annulus and used to  secure the ring in place.  With the ring in place again the valve was  tested passively with filling with saline and was competent.  The  patient's body temperature was rewarmed.  The inner atrial incision was  closed with a horizontal mattress  3-0 Prolene.  Prior to closure of the  left atrium a double layer running 4-0 Prolene was used to close the  left atrial appendage.  Aortic crossclamp.  The heart was allowed to  passively deair through the atriotomy.  Prior to closure the aortic  crossclamp was removed with total crossclamp time of 88 minutes.  The  patient required electrical defibrillation and returned to a sinus  rhythm.  She was rewarmed to 37 degrees, started on low-dose dopamine  and milrinone.  She was then ventilated and weaned from cardiopulmonary  bypass without difficulty.  She remained hemodynamically stable.  She  was decannulated in the usual fashion.  TEE showed good function of the  mitral valve with no evidence of residual mitral regurgitation.  She was  decannulated in the usual fashion.  Protamine sulfate was administered  with operative hemostatic.  Two Blake drains were left; one posterior  and one anterior in the pericardium.  Sternum was closed with #6  stainless steel wire.  Fascia closed with interrupted 0-Vicryl running  though subcutaneous tissue.  Then 4-0 subcuticular stitch was placed in  the skin edges.  Dry dressings were applied.  Sponge and needle count  was reported as correct at completion of the procedure.  The patient  tolerated the procedure without obvious complication, was transferred to  surgical intensive care unit for further postoperative care.      Tabitha Plane, MD  Electronically Signed     EG/MEDQ  D:  04/04/2007  T:  04/05/2007  Job:  [id number]   cc:   Cassell Clement, M.D.

## 2010-10-03 NOTE — Cardiovascular Report (Signed)
[name], [name]              ACCOUNT NO.:  [id number]   MEDICAL RECORD NO.:  [id number]          PATIENT TYPE:  INP   LOCATION:  4736                         FACILITY:  [facility]   PHYSICIAN:  Elmore Guise., M.D.DATE OF BIRTH:  March 23, 1918   DATE OF PROCEDURE:  04/01/2007  DATE OF DISCHARGE:                            CARDIAC CATHETERIZATION   INDICATION FOR PROCEDURE:  Preoperative evaluation prior to possible  mitral valve repair.   PROCEDURE DESCRIPTION:  The patient was brought to the cardiac cath lab  after appropriate informed consent.  She was prepped and draped in  sterile fashion.  Approximately 5 cc of 1% lidocaine was used for local  anesthesia.  A 5-French sheath was placed in the right femoral artery  without difficulty.  Coronary angiography and LV angiography were then  performed.  The patient tolerated the procedure well and was transferred  from the cath lab in stable condition.   FINDINGS:  1. Left Main:  Short; no significant obstructive disease noted.  2. LAD:  Moderate-sized vessel, mild luminal irregularities.  3. D1:  Small vessel, mild luminal irregularities.  4. LCX:  Moderate size with mild luminal irregularities.  5. OM-1/OM-2:  Both moderate-sized vessels with mild luminal      irregularities.  Moderate sized distal branching was noted.  No      significant obstructive disease was seen.  6. RCA:  Dominant proximal 30-40% stenosis with mid 40-50% stenosis      with distal luminal irregularities.  7. LV:  EF 55%.  Vertically oriented heart muscle.  No wall motion      abnormalities.  LVEDP is 14 mmHg.  MR was noted.  (We did not do a      power injection to save her contrast load.  She does have known      severe mitral regurgitation by her TEE).   IMPRESSION:  1. Nonobstructive coronary arteries.  2. Preserved left ventricular systolic function with an ejection      fraction of 55% and an left ventricular end diastolic pressure of      14  mmHg.  3. Severe mitral regurgitation by transesophageal echocardiogram.   PLAN AT THIS TIME:  At this time, I have recommend surgical referral for  mitral valve repair.      Elmore Guise., M.D.  Electronically Signed     TWK/MEDQ  D:  04/01/2007  T:  04/01/2007  Job:  [id number]   cc:   Cassell Clement, M.D.

## 2010-10-03 NOTE — Assessment & Plan Note (Signed)
OFFICE VISIT   Tabitha, [name]  DOB:  Oct 25, 1917                                        May 01, 2007  CHART #:  [id number]   Tabitha [name] returns to the office today for a followup appointment after  her mitral valve repair with a quadrangle resection with annuloplasty  ring and suture closure of the left atrial appendage April 04, 2007.  Considering her age of 76 years, she has made very remarkable progress  postoperatively.  She denies needing any pain medicine, has increase in  her activity appropriately, has had no evidence of congestive heart  failure.  She notes that the home nurse has been drawing her blood and  Coumadin has been adjusted by Dr. Yevonne Pax office.   PHYSICAL EXAMINATION:  Vital Signs:  Blood pressure 137/78.  Pulse 68  and regular.  Respiratory rate 18.  O2 sat 96%.  General:  Patient  appears as if she has not had any surgery done and has no specific  complaints and is anxious to return to increasing her activity level.  Her sternum is stable and well-healed.  Lungs are clear bilaterally.  I  did not appreciate any murmur, mitral insufficiency.  She has no pedal  edema.   FOLLOWUP:  X-rays shows clear lung fields bilaterally.   ASSESSMENT/PLAN:  Patient appears to be doing extremely well.  She  appears to be in sinus rhythm at this time.  If she stays in sinus  rhythm 6 to 8 weeks postop, she could convert from Coumadin to aspirin  alone.  I have allowed her to return to driving.  Will discontinue her  home health nurse visits at this time.  Overall, I am very pleased with  the progress.  I do plan to see her back in 3 months.   Tabitha Plane, MD  Electronically Signed   EG/MEDQ  D:  05/01/2007  T:  05/02/2007  Job:  [id number]   cc:   Cassell Clement, M.D.

## 2010-10-03 NOTE — Assessment & Plan Note (Signed)
OFFICE VISIT   KANAYA, GUNNARSON  DOB:  09/18/1917                                        September 11, 2007  CHART #:  [id number]   The patient is an 77 year old female who underwent mitral valve repair  with quadrangular resection of the posterior leaflet and placement of  annuloplasty ring and suture closure of left atrial appendage on  April 04, 2007.  Considering her age of 77 years, she is making  excellent progress.  She returned to near normal activities without  difficulty.  She is currently in cardiac rehab program.  She has no  symptoms of overt congestive heart failure.   PHYSICAL EXAMINATION:  VITAL SIGNS:  Blood pressure 127/78, pulse is 71  with occasional ectopic beat but does not appear to be in atrial  fibrillation, respiratory rate 18, O2 saturation 94%.  CHEST:  Her sternum is stable and well-healed.  Her lungs are clear  bilaterally.  CARDIAC:  I do not hear any murmur of mitral insufficiency.  EXTREMITIES:  She has no pedal edema.   CURRENT MEDICATIONS:  Digoxin, potassium, Lasix, Toprol and aspirin.  She is no longer on Coumadin therapy.   Chest x-ray done in Dr. Yevonne Pax office shows chronic interstitial  lung markings but no evidence of pleural effusions.   The patient notes that she had an echocardiogram done several weeks ago  in Dr. Yevonne Pax office, but I do not have report of this.   Overall, the patient has done extremely well even considering her age of  77 years.  I have not made a return appointment today.  She is very  closely followed by Dr. Patty Sermons.  I will be glad to see her at any  time at his request.   Sheliah Plane, MD  Electronically Signed   EG/MEDQ  D:  09/11/2007  T:  09/11/2007  Job:  [id number]   cc:   Cassell Clement, M.D.

## 2010-10-03 NOTE — H&P (Signed)
Tabitha [name], Tabitha [name]              ACCOUNT NO.:  [id number]   MEDICAL RECORD NO.:  [id number]          PATIENT TYPE:  INP   LOCATION:  4733                         FACILITY:  [facility]   PHYSICIAN:  Christell Faith, MD   DATE OF BIRTH:  May 03, 1918   DATE OF ADMISSION:  03/28/2007  DATE OF DISCHARGE:                              HISTORY & PHYSICAL   PRIMARY CARE PHYSICIAN:  The patient does not have a primary care  physician or primary cardiologist. The patient will be admitted to Dr.  Cassell Clement, with [facility].   CHIEF COMPLAINT:  I have no energy.   HISTORY OF PRESENT ILLNESS:  This is an 77 year old white female with  very little medical history.  She lives alone, is highly functional and  physically active.  For example she hiked 2 miles earlier this week and  then played bridge for several hours yesterday and felt very good with  these activities.  Today, however, she woke up feeling very poorly.  She  says she had no energy, did not want to get out of bed and had no  appetite.  She denies racing heart, chest pain or shortness of breath  today, just feeling weak and tired.   The patient initially presented to [facility] where she was  found to be tachycardic with abnormal rhythm.  Emergency room physician  there felt it was most likely sinus tachycardia. The patient was given 5  mg of Lopressor at the outside hospital and her heart rate dropped from  120 to 80.  She was transferred hemodynamically stable.   PAST MEDICAL HISTORY:  1. Mitral valve prolapse and MR diagnosed on echocardiogram at [facility] in July 2008.  The echocardiogram was a preoperative study      prior to eye surgery.  2. She has had eye surgery for clogged tear ducts.  3. Glaucoma.  4. Osteoarthritis.  5. Osteoporosis.   SOCIAL HISTORY:  She lives in [place], [place], alone.  She  does have a puppy dog. She drinks a glass of wine most evenings.  She  quit  tobacco in 1969.   FAMILY HISTORY:  The patient is a widow.  Her husband was an ENT  physician.  The patient has 3 children who live in [place],  [place] and [place].   ALLERGIES:  PENICILLIN.   MEDICATIONS:  1. Xalatan 1 eye drop each eye q.h.s.  2. Timolol 1 drop left eye q.a.m.  3. Fosamax every Friday morning.  4. Occassional baby aspirin   REVIEW OF SYSTEMS:  Positive for weight loss and gradually increasing  dyspnea on exertion for the past 2 to 3 months.  Otherwise the balance  of 14 systems is reviewed in detail and is negative.   PHYSICAL EXAMINATION:  VITAL SIGNS:  Initial vital signs at outside  hospital heart rate 120, blood pressure 118/70, saturation 96% on room  air.  Initial vital signs [facility] temperature 97.4, pulse  84, respiratory rate 18, blood pressure 98/67.  Weight 50.6 kg.  GENERAL:  This is  an elderly white female in no distress resting  comfortably in the bed.  She is very thin.  HEENT:  Pupils equal, round and reactive.  Extraocular movements are  intact.  Mucous membranes are moist.  NECK:  Supple.  Neck veins are flat.  No carotid bruits.  No  thyromegaly.  No cervical lymphadenopathy.  CARDIAC:  The rate is tachycardic.  The rhythm sounds irregular.  There  is a 4/6 holosystolic murmur at the apex radiating to the axilla.  There  is a palpable thrill.  LUNGS:  Clear to auscultation bilaterally without wheezing or rales.  ABDOMEN:  Soft, nontender, nondistended.  Very thin. The liver, spleen  and aorta are all palpable but none seem pathologically enlarged.  EXTREMITIES:  Reveal no edema, 2+ dorsalis pedis and radi pulses  bilaterally.  No clubbing or cyanosis.  Positive osteoarthritic changes  of the hands.  NEUROLOGIC:  She is alert, awake and oriented times 3.  Facial  expressions are symmetrical and normal and 5/5 strength in the upper and  lower extremities.   DIAGNOSTIC STUDIES:  Electrocardiogram from 13-Oct-2212 reveals  a rate of 84  with 2:1 atrial tachycardia.   Labs:  White blood cells 9, hemoglobin 12.8, platelets 189.  Sodium 136,  potassium 3.9, BUN 14, creatinine 1. CK-MB less than 1, troponin less  than 0.05.   IMPRESSION:  An 77 year old white female with severe mitral  regurgitation now in atrial tachycardia presenting with symptomatic  fatigue.   PLAN:  1. Admit to telemetry, Dr. Patty Sermons.  2. We will check a transthoracic echocardiogram to evaluate her mitral      valve, her cardiac function and her heart size. Given her age, she      probably needs to be treated conservatively, however, she remains      fairly active and appears younger than her stated age, and so we      may consider discussing her mitral valve case with the CT surgeons.  3. Check BNP, magnesium and TSH.  4. Initiate aspirin 325 mg daily.  5. Initiate Lopressor 12.5 mg t.i.d. for continued rate control.  6. We will consider cardioverting this patient back into sinus rhythm.      She would probably be able to take Coumadin if indicated.      Alternatively we could elect to just rate control her and place her      on aspirin.  I think this represents and an atrial tachycardia,      although atypical atrial flutter is not entirely excluded.      Consider EP consult.  7. We will continue her glaucoma eye drops.  8. We will prescribe DVT prophylaxis.      Christell Faith, MD  Electronically Signed     NDL/MEDQ  D:  03/28/2007  T:  03/28/2007  Job:  [id number]
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
## 2010-10-03 NOTE — Discharge Summary (Signed)
NAMEELLIOT, Tabitha [name]              ACCOUNT NO.:  [id number]   MEDICAL RECORD NO.:  [id number]          PATIENT TYPE:  INP   LOCATION:  2031                         FACILITY:  [facility]   PHYSICIAN:  Sheliah Plane, MD    DATE OF BIRTH:  Nov 02, 1917   DATE OF ADMISSION:  03/28/2007  DATE OF DISCHARGE:  04/11/2007                               DISCHARGE SUMMARY   HISTORY OF PRESENT ILLNESS:  The patient is an 77 year old female with  very little medical history.  She lives alone and is highly functional  and physically active.  For example, she hiked 2 miles earlier in the  week prior to admission and then played for several hours on the day  before admission feeling quite good with activities.  On the day of  admission; however, she woke to feeling very poorly.  She said she had  no energy, did not want to get out of bed, and had no appetite.  She  denied tachycardia, chest pain, or shortness of breath.  However, she  did feel significantly fatigued.  She presented to [facility]  where she was found to be tachycardic with an abnormal rhythm.  The  patient was given Lopressor and her heart rate dropped from 120 to 80.  She was felt to require further evaluation and treatment as she was  found to have a loud mitral regurgitant murmur on physical examination.  The patient was transferred to [facility] to be managed by Dr.  Cassell Clement.   PAST MEDICAL HISTORY:  Includes:  1. Mitral valve prolapse diagnosed by echocardiogram at [facility] in July 2008.  The echocardiogram was a preoperative      evaluation for eye surgery.  2. She had eye surgery for clogged tear ducts.  3. Glaucoma.  4. Osteoarthritis.  5. Osteoporosis.   MEDICATIONS:  Prior to admission:  1. Xalatan eye drops one drop each eye nightly.  2. Timolol one drop left eye every a.m.  3. Fosamax every Friday morning.   ALLERGIES:  INCLUDE PENICILLIN.   For family history, social history,  review of systems, and physical  examination please see the history and physical done at the time of  admission.   HOSPITAL COURSE:  The patient was admitted to telemetry.  An  echocardiogram was obtained, which confirmed of the severe mitral  regurgitation.  She had labs obtained and her BMP evidenced congestive  heart failure.  She was evaluated by cardiology and they managed her  medically and deemed that she should proceed with cardiac  catheterization.  First a TEE was done on March 31, 2007.  This  showed severe mitral regurgitation and prolapse of the medial scallop of  the posterior mitral valve leaflet.  It also noted a normal left  ventricular function.  She then proceeded to cardiac catheterization on  April 01, 2007, which was done by Dr. Reyes Ivan.  She was found to have  preserved left ventricular function with an ejection fraction of 55%.  She was also found to have nonobstructive coronary artery disease.  Due  to these findings, surgical consultation was then obtained.  Finally Dr.  Evelene Croon evaluated the patient and studies.  He agreed with the  recommendations to proceed with mitral valve repair.  The patient  underwent preoperative pulmonary function studies and these were felt to  be acceptable.  Her FEV1 was 1.10 or 75% predicted and her FVC was 1.18,  52% predicted.  Findings were consistent with minimal obstructive airway  disease and moderately severe restrictive disease possibly.  The surgery  was scheduled and on April 04, 2007, she was taken to the operating  room at which time she underwent the following procedure:  Mitral valve  repair with a 26 mm Edwards physio ring annuloplasty.  Additionally, she  underwent resection of a middle scallop of the posterior leaflet and a  left atrial appendage ligation.  She tolerated this procedure well and  was taken to the surgical intensive care unit in stable condition.   POSTOPERATIVE HOSPITAL COURSE:  The  patient overall has done well.  She  has remained neurologically intact.  She has remained hemodynamically  stable.  Her cardiac rhythm initially showed frequent atrial ectopic  beats, but did not subsequently convert to an atrial fibrillation.  She  has been digitalized.  Her rate is controlled.  She has been started on  Coumadin for both the atrial fibrillation as well as the valve.  Her  laboratory values do reveal a moderate  postoperative anemia.  Her most  recent hemoglobin and hematocrit dated April 08, 2007, was 10/31  respectively.  Her most recent INR April 10, 2007, was 1.6.  Her  incisions are all healing well without signs of infection.  She has had  some postoperative volume overload that has been responding well to  diuretics.  Her oxygenation has shown steady improvement and she  maintains good saturations on room air.  Her BUN and creatinine are  stable and on April 10, 2007, they are 8 and 0.79 respectively.  Her  incision is healing well without signs of infection.  She is tolerating  gradual increase in activity using cardiac rehabilitation modalities.  Her overall status is felt to be tentatively stable for discharge in the  morning of April 11, 2007, pending morning round reevaluation.   DISCHARGE INSTRUCTIONS:  The patient will receive written instructions  regarding medications, activity, diet, wound care, and followup.  Followup will include a home health nurse to draw blood with the results  to Dr. Patty Sermons.  PT and INR to be done twice weekly starting April 14, 2007.  She is instructed to followup with Dr. Patty Sermons in 2 weeks.  Dr. Tyrone Sage on May 01, 2007, at 4:45 p.m.   MEDICATIONS:  At the time of this dictation include the following for  home:  1. Aspirin 81 mg daily.  2. Toprol XL 25 mg daily.  3. Folic acid 1 mg daily.  4. Coumadin dosage to be determined at the time of discharge.  5. Lanoxin 0.125 mg daily.  6. She is also to  continue her Xalatan and Timoptic eye drops as      previously.   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   OTHER DIAGNOSES:  Include:  1. Atrial fibrillation with plans for possible cardioversion in      approximately 4 weeks by the cardiologist.  She remains in atrial      fibrillation in hopes to get her off Coumadin therapy if possible.  2. Postoperative anemia.  3. Glaucoma.  4. Osteoarthritis.  5. Osteoporosis.  6. Remote tobacco use.  She quit tobacco in 1969.      Rowe Clack, P.A.-C.      Sheliah Plane, MD  Electronically Signed    WEG/MEDQ  D:  04/10/2007  T:  04/11/2007  Job:  [id number]   cc:   Cassell Clement, M.D.  Sheliah Plane, MD

## 2010-10-03 NOTE — Consult Note (Signed)
[name], Tabitha [name]              ACCOUNT NO.:  [id number]   MEDICAL RECORD NO.:  [id number]          PATIENT TYPE:  INP   LOCATION:  4736                         FACILITY:  [facility]   PHYSICIAN:  Evelene Croon, M.D.     DATE OF BIRTH:  [birth year]   DATE OF CONSULTATION:  04/01/2007  DATE OF DISCHARGE:                                 CONSULTATION   TYPE OF CONSULTATION:  Cardiovascular surgical.   REFERRING PHYSICIAN:  Cassell Clement, M.D.   REASON FOR CONSULTATION:  Severe mitral regurgitation.   CLINICAL HISTORY:  I was asked by Dr. Cassell Clement and Dr. Lady Deutscher to evaluate this 77 year old woman who presented with progressive  shortness of breath and congestive heart failure secondary to severe  mitral regurgitation and paroxysmal atrial tachycardia.   She is a very functional and active white female with minimal medical  history who was admitted on March 28, 2007, after waking up feeling  very poorly with no energy and felt like she could not get out of bed.  She has had progressive exertional dyspnea over the past couple months  but said that she did hike 2 miles earlier in the week and played bridge  for several hours and felt fine.   She presented to the [facility] emergency room where she was found to be  tachycardiac and what was felt to be sinus tachycardia in the 120s.  She  was transferred to [facility] where her rhythm was felt to be paroxysmal  atrial tachycardia.  She had a loud murmur of mitral regurgitation.  Her  CPK and troponin levels were negative.  She was admitted for control of  her heart rate and diuresis.   An echocardiogram was performed that showed severe mitral regurgitation.  She improved with treatment of her heart failure with diuresis and  control of her heart rhythm.   She subsequently underwent a transesophageal echocardiogram which showed  severe mitral regurgitation felt secondary to a prolapse or flailed  medial scallop of the  posterior mitral leaflet secondary to a ruptured  chordae.  She had normal left ventricular function.   She subsequently underwent cardiac catheterization today which showed  insignificant coronary disease with normal left ventricular function  with an ejection fraction estimated at 55%.  Left ventricular end-  diastolic pressure was 14 mmHg.   REVIEW OF SYSTEMS:  GENERAL:  She denies any fever or chills.  She has  had weight loss over the past year which she attributes to decreased  appetite.  She does report progressive fatigue over the past several  months.  EYES:  Negative.  ENT:  Negative.  She did see a dentist within  the past year to have her teeth cleaned and had no problems at that  time.  ENDOCRINE:  She denies diabetes or hypothyroidism.  CARDIOVASCULAR:  She denies any chest pain or pressure.  She denies PND  or orthopnea.  She has had progressive exertional dyspnea.  She denies  peripheral edema.  She denies any history of palpitations.  RESPIRATORY:  She denies cough or sputum production.  GI:  She has had no nausea or  vomiting.  She denies melena or bright red blood per rectum.  GU:  She  denies dysuria or hematuria.  MUSCULOSKELETAL:  She denies arthralgias  or myalgias.  ALLERGIES:  PENICILLIN.  PSYCHIATRIC:  Negative.  HEMATOLOGICAL:  Negative.   PAST MEDICAL HISTORY:  Significant for:  1. Diagnosis of mitral valve prolapse by echocardiogram at [facility] in July of 2008.  This was done preoperatively for eye      surgery.  She underwent eye surgery for clogged tear ducts.  2. History of glaucoma.  3. History of osteoarthritis.  4. History of osteoporosis.   SOCIAL HISTORY:  She is widowed and lives in [place], [place],  by herself.  She quit smoking in 1969.  She drinks a glass of wine per  day.   FAMILY HISTORY:  She has three children who live in [place],  [place] and [place].  She denies any family history of cardiac   disease.   MEDICATIONS PRIOR TO ADMISSION:  1. Fosamax every Friday morning.  2. Timolol 1 drop left eye q.a.m.  3. Xalatan 1 drop each eye q.h.s.   PHYSICAL EXAMINATION:  VITAL SIGNS:  Her blood pressure is 106/63, and  her pulse is 78 and regular.  Respiratory rate is 16 and unlabored.  GENERAL:  She is a thin, elderly white female in no distress.  HEENT EXAM:  Shows to be normocephalic and atraumatic.  Pupils are equal  and reactive to light and accommodation.  Extraocular muscles are  intact.  Her teeth are in good condition.  NECK EXAM:  Shows normal carotid pulse bilaterally.  There are no  bruits.  There is no adenopathy or thyromegaly.  There is no JVD.  CARDIAC EXAM:  Shows regular rate and rhythm with a grade 4/6 systolic  murmur of mitral regurgitation at the apex radiating into the axilla.  This is audible throughout the precordium with a palpable thrill.  LUNG EXAM:  Clear.  ABDOMINAL EXAM:  Shows active bowel sounds.  Abdomen is soft and  nontender.  There are no palpable masses or organomegaly.  EXTREMITY EXAM:  Shows no peripheral edema.  Pedal pulse is palpable  bilaterally.  SKIN:  Warm and dry.  NEUROLOGIC EXAM:  Shows to be alert and oriented x3.  Motor and sensory  exams are grossly normal.   LABS AND ANCILLARY DATA:  Electrocardiogram on November 8 shows normal  sinus rhythm with no acute abnormality.  Chest x-ray shows cardiomegaly  with improved pulmonary edema and chronic interstitial lung disease as  well as some emphysema.  Laboratory examination on admission showed a  BUN of 48, creatinine of 1 with a hemoglobin of 12.8 and platelets 189.   IMPRESSION:  [name] has severe mitral regurgitation secondary to a  flail segment of the posterior mitral leaflet likely resulting from a  ruptured chordae.  She has had 2-3 months of progressive exertional  dyspnea that worsened suddenly on the day of admission.  She has  insignificant coronary disease and  preserved left ventricular function.  She has been tuned up and is essentially ready for surgery.  I told her  that I would not be able to do surgery until November 26 since I am  going to be gone for the next 10 days.  I think that she would be best  treated by surgery as soon as possible given her age and having severe  mitral regurgitation.  I would be concerned about  having her wait that long because she may decompensate and turn into a  nonoperative candidate very quickly.  I discussed this with her and her  friend.  I will discuss it with Dr. Patty Sermons tomorrow, and if he is in  agreement, will plan to have one of my partners do her surgery as soon  as possible.      Evelene Croon, M.D.  Electronically Signed     BB/MEDQ  D:  04/01/2007  T:  04/02/2007  Job:  [id number]   cc:   Cassell Clement, M.D.
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
## 2010-10-03 NOTE — Procedures (Signed)
[name], Tabitha [name]              ACCOUNT NO.:  [id number]   MEDICAL RECORD NO.:  [id number]         PATIENT TYPE:  POUT   LOCATION:  RESP                          FACILITY:  [facility]   PHYSICIAN:  Edward L. Juanetta Gosling, M.D.DATE OF BIRTH:  [birth year]   DATE OF PROCEDURE:  12/24/2008  DATE OF DISCHARGE:                            PULMONARY FUNCTION TEST   1. Spirometry shows a mild ventilatory defect with airflow obstruction      at the level of the smaller airways.  The overall appearance of the      flow-volume loop is more typical of restrictive lung disease.  2. Lung volumes show mild reduction in total lung capacity.  3. DLCO is moderately to severely reduced.  4. Arterial blood gases are normal.  5. There is no significant bronchodilator improvement.      Edward L. Juanetta Gosling, M.D.  Electronically Signed     ELH/MEDQ  D:  12/27/2008  T:  12/27/2008  Job:  [id number]

## 2010-10-03 NOTE — Op Note (Signed)
Tabitha [name], Tabitha [name]              ACCOUNT NO.:  [id number]   MEDICAL RECORD NO.:  [id number]           PATIENT TYPE:   LOCATION:                                 FACILITY:   PHYSICIAN:  Burna Forts, M.D.DATE OF BIRTH:  [birth year]   DATE OF PROCEDURE:  04/04/2007  DATE OF DISCHARGE:                               OPERATIVE REPORT   PROCEDURES:  Transesophageal echocardiography.   INDICATIONS FOR PROCEDURE:  Tabitha [name] is an 77 year old female patient  of Dr. Patty Sermons who presents today for mitral valve repair or  replacement to be performed by Dr. Sheliah Plane.  We have been asked  to place a TEE probe for evaluation of cardiac structures and function  and pre and postoperative repair or replacement.   DESCRIPTION OF PROCEDURE:  The patient was brought to the holding area  the morning of surgery where under local anesthesia, a PA catheter  __________ were placed.  She was then taken to the OR for routine  induction of general anesthesia after which the TEE probe is protected,  lubricated and passed oropharyngeally into the stomach for imaging of  the cardiac structures.   PRE CARDIOPULMONARY BYPASS EXAMINATION:  Left ventricle.  This is a  fairly significantly dilated left ventricular chamber seen in a short  axis view.  There is increased volume chamber size indicated by  increased diameter.  There is overall good contractility noted with  contractile patterns that are normal in the anterior, lateral,  posterior, inferior and septal walls.  This would be commensurate with a  good overall ejection fraction.  Long-axis view again shows posterior  and anterior walls  that are contractile.   Mitral valve:  The probe has been slightly withdrawn in the stomach for  imaging of a four-chamber view showing the mitral valve.  Immediately  upon visualization of the mitral valve can be seen a fairly thickened,  degenerative appearing anterior and posterior leaflets.  There  appeared  to be prolapsing of both leaflets with a, what I would describe as  knuckles of flail segment presumably P2 well into the left atrial  chamber.  This is associated with on color Doppler examination a fairly  large jet of regurgitant flow from beneath the posterior leaflet out  over the top of the anterior leaflet layering out well into the left  atrial chamber.  Multiple views are obtained in both commissural views  and a long-axis view.  Again this demonstrates primarily significant  prolapse of the flail segment of the one aspect of the posterior  leaflet.   Left atrium.  This is an enlarged left atrial chamber with jet seen  filling well into the posterior aspect of this chamber.  The left atrial  appendage is visualized.  There are no masses noted within or clots.  The interatrial septum is interrogated. Again, it is intact.  The  pulmonary vein could not be visualized with ease in this view so we  could not do the assessment of flow within this vein.  However, the  regurgitant flow would have been accessed as 4+.  Aortic valve:  Mild aortic sclerosis is noted.  There are three cusps.  They opened appropriately during systolic contraction, closed  satisfactorily during diastole.  There is no diastolic regurgitant flow  noted.   Right ventricle.  The large right ventricular chamber is noted.  This is  associated with a slightly dilated tricuspid annular area, but the  tricuspid valve itself appears normal.  It is associated with about a 1  to 2+ tricuspid regurgitant flow noted into the right atrial chamber.  Also noted is a pulmonary artery catheter visualized well within the  right atrial chamber as well as the artery.   The patient was placed on cardiopulmonary bypass.  Hypothermia is begun.  The procedure is ring annuloplasty and repair of the mitral valve.  After this, patient is rewarmed on cardiopulmonary bypass and separation  of cardiopulmonary bypass takes  place with the initial attempt.   Post cardiopulmonary TEE examination:  (Limited exam)   Mitral valve.  The mitral valve area is again seen in four-chamber view  as well as long axis views, demonstrating a nice ring annuloplasty  repair associated with a more limited excursion of anterior and  posterior leaflets.  Doppler examination, multiple views, reveals  essentially no regurgitant flow noted across the mitral valve.  This is  considered in good excellent repair.   Left ventricle.  The left ventricular chamber is notable in the post  bypass period for a septal and inferior wall hypokinesis which is  significantly different from the prebypass period.  It is suspected that  some air entrainment had taken place.  There was however, recent  contractility noted in the lateral, anterolateral, anterior and nasal  septal areas, satisfactory for contractility and coming off from the  bypass with the initial attempt.  With time and the addition of  inotropes the areas of hypocontractility appeared to improve with time  and separation from the cardiopulmonary bypass.   The rest of the cardiac examination, left atrium, right ventricle and  tricuspid valve, etc, were as previously described without any  significant changes and the patient was returned to the  cardiac  intensive care unit in stable condition.           ______________________________  Burna Forts, M.D.     JTM/MEDQ  D:  04/04/2007  T:  04/05/2007  Job:  [id number]

## 2011-01-09 ENCOUNTER — Telehealth: Payer: Self-pay

## 2011-01-09 MED ORDER — DIGOXIN 125 MCG PO TABS: 125.0000 ug | ORAL_TABLET | Freq: Every day | ORAL | Status: DC

## 2011-01-11 ENCOUNTER — Other Ambulatory Visit: Payer: Self-pay

## 2011-02-27 LAB — COMPREHENSIVE METABOLIC PANEL
AST: 24
Albumin: 3.2 — ABNORMAL LOW
Albumin: 3.4 — ABNORMAL LOW
Alkaline Phosphatase: 53
BUN: 14
BUN: 14
BUN: 18
CO2: 23
CO2: 26
Calcium: 8.5
Chloride: 106
Creatinine, Ser: 1.01
Creatinine, Ser: 0.93
Creatinine, Ser: 0.96
GFR calc Af Amer: >60
GFR calc non Af Amer: 52 — ABNORMAL LOW
GFR calc non Af Amer: 57 — ABNORMAL LOW
Glucose, Bld: 130 — ABNORMAL HIGH
Glucose, Bld: 189 — ABNORMAL HIGH
Potassium: 3.4 — ABNORMAL LOW
Total Bilirubin: 1.6 — ABNORMAL HIGH
Total Bilirubin: 1.7 — ABNORMAL HIGH
Total Protein: 6.8
Total Protein: 6.9

## 2011-02-27 LAB — BASIC METABOLIC PANEL
BUN: 10
BUN: 8
BUN: 9
BUN: 9
BUN: 9
BUN: 9
BUN: 17
BUN: 20
BUN: 21
BUN: 22
BUN: 24 — ABNORMAL HIGH
BUN: 25 — ABNORMAL HIGH
CO2: 26
CO2: 27
CO2: 27
CO2: 29
CO2: 29
CO2: 30
CO2: 28
CO2: 30
CO2: 30
CO2: 31
CO2: 31
Calcium: 7.1 — ABNORMAL LOW
Calcium: 7.2 — ABNORMAL LOW
Calcium: 7.5 — ABNORMAL LOW
Calcium: 7.6 — ABNORMAL LOW
Calcium: 7.9 — ABNORMAL LOW
Calcium: 8.4
Calcium: 8.6
Calcium: 8.6
Calcium: 8.7
Calcium: 9.2
Calcium: 9.3
Calcium: 9.5
Chloride: 100
Chloride: 100
Chloride: 104
Chloride: 105
Chloride: 108
Chloride: 99
Chloride: 101
Chloride: 101
Chloride: 95 — ABNORMAL LOW
Chloride: 96
Chloride: 99
Creatinine, Ser: 0.65
Creatinine, Ser: 0.67
Creatinine, Ser: 0.68
Creatinine, Ser: 0.7
Creatinine, Ser: 0.72
Creatinine, Ser: 0.74
Creatinine, Ser: 0.79
Creatinine, Ser: 0.88
Creatinine, Ser: 0.91
Creatinine, Ser: 0.93
Creatinine, Ser: 0.96
Creatinine, Ser: 1.13
GFR calc Af Amer: >60
GFR calc Af Amer: >60
GFR calc Af Amer: >60
GFR calc Af Amer: >60
GFR calc Af Amer: >60
GFR calc Af Amer: >60
GFR calc Af Amer: 55 — ABNORMAL LOW
GFR calc Af Amer: >60
GFR calc Af Amer: >60
GFR calc non Af Amer: >60
GFR calc non Af Amer: >60
GFR calc non Af Amer: >60
GFR calc non Af Amer: >60
GFR calc non Af Amer: >60
GFR calc non Af Amer: >60
GFR calc non Af Amer: >60
GFR calc non Af Amer: 56 — ABNORMAL LOW
Glucose, Bld: 101 — ABNORMAL HIGH
Glucose, Bld: 105 — ABNORMAL HIGH
Glucose, Bld: 106 — ABNORMAL HIGH
Glucose, Bld: 109 — ABNORMAL HIGH
Glucose, Bld: 147 — ABNORMAL HIGH
Glucose, Bld: 93
Glucose, Bld: 99
Glucose, Bld: 100 — ABNORMAL HIGH
Glucose, Bld: 100 — ABNORMAL HIGH
Glucose, Bld: 104 — ABNORMAL HIGH
Glucose, Bld: 118 — ABNORMAL HIGH
Glucose, Bld: 84
Potassium: 3.4 — ABNORMAL LOW
Potassium: 3.4 — ABNORMAL LOW
Potassium: 3.4 — ABNORMAL LOW
Potassium: 3.7
Potassium: 3.8
Potassium: 3.9
Potassium: 3.6
Potassium: 3.7
Potassium: 4
Sodium: 133 — ABNORMAL LOW
Sodium: 134 — ABNORMAL LOW
Sodium: 135
Sodium: 135
Sodium: 137
Sodium: 140
Sodium: 140
Sodium: 136
Sodium: 137

## 2011-02-27 LAB — CBC
HCT: 28.5 — ABNORMAL LOW
HCT: 30.3 — ABNORMAL LOW
HCT: 31.7 — ABNORMAL LOW
HCT: 32 — ABNORMAL LOW
HCT: 33 — ABNORMAL LOW
HCT: 37.5
HCT: 35 — ABNORMAL LOW
HCT: 35.9 — ABNORMAL LOW
HCT: 41.5
Hemoglobin: 10.3 — ABNORMAL LOW
Hemoglobin: 10.7 — ABNORMAL LOW
Hemoglobin: 10.9 — ABNORMAL LOW
Hemoglobin: 11.2 — ABNORMAL LOW
Hemoglobin: 9.1 — ABNORMAL LOW
Hemoglobin: 12.8
Hemoglobin: 12.2
Hemoglobin: 14.1
MCHC: 31.8
MCHC: 33.8
MCHC: 33.8
MCHC: 33.9
MCHC: 34
MCHC: 34
MCHC: 33.6
MCHC: 33.9
MCHC: 33.9
MCV: 90.6
MCV: 90.7
MCV: 91.7
MCV: 91.8
MCV: 91.9
MCV: 90.4
MCV: 89.8
MCV: 90.4
MCV: 91.8
MCV: 92
Platelets: 109 — ABNORMAL LOW
Platelets: 109 — ABNORMAL LOW
Platelets: 112 — ABNORMAL LOW
Platelets: 124 — ABNORMAL LOW
Platelets: 163
Platelets: 139 — ABNORMAL LOW
Platelets: 170
Platelets: 187
Platelets: 202
RBC: 3.11 — ABNORMAL LOW
RBC: 3.3 — ABNORMAL LOW
RBC: 3.46 — ABNORMAL LOW
RBC: 3.53 — ABNORMAL LOW
RBC: 3.64 — ABNORMAL LOW
RBC: 3.99
RBC: 4.06
RBC: 4.59
RDW: 14
RDW: 14.1
RDW: 14.3
RDW: 14.5
RDW: 14.8
RDW: 14.7 — ABNORMAL HIGH
RDW: 14.1
RDW: 14.2
RDW: 14.3
RDW: 14.6
RDW: 14.6 — ABNORMAL HIGH
WBC: 10.6 — ABNORMAL HIGH
WBC: 12.6 — ABNORMAL HIGH
WBC: 14 — ABNORMAL HIGH
WBC: 15.3 — ABNORMAL HIGH
WBC: 8.5
WBC: 14.2 — ABNORMAL HIGH
WBC: 16.3 — ABNORMAL HIGH

## 2011-02-27 LAB — POCT I-STAT 3, ART BLOOD GAS (G3+)
Acid-base deficit: 1
Acid-base deficit: 1
Bicarbonate: 27.9 — ABNORMAL HIGH
Bicarbonate: 24.1 — ABNORMAL HIGH
Bicarbonate: 24.3 — ABNORMAL HIGH
Bicarbonate: 25 — ABNORMAL HIGH
Bicarbonate: 28.7 — ABNORMAL HIGH
O2 Saturation: 99
O2 Saturation: 99
O2 Saturation: 99
Patient temperature: 35.4
Patient temperature: 37
TCO2: 29
TCO2: 25
TCO2: 26
TCO2: 30
pCO2 arterial: 46.3 — ABNORMAL HIGH
pCO2 arterial: 41.7
pCO2 arterial: 42.7
pH, Arterial: 7.446 — ABNORMAL HIGH
pH, Arterial: 7.487 — ABNORMAL HIGH
pO2, Arterial: 151 — ABNORMAL HIGH
pO2, Arterial: 149 — ABNORMAL HIGH
pO2, Arterial: 161 — ABNORMAL HIGH

## 2011-02-27 LAB — PROTIME-INR
INR: 1.1
INR: 1.2
INR: 1.6 — ABNORMAL HIGH
INR: 1.9 — ABNORMAL HIGH
INR: 1
INR: 1.4
Prothrombin Time: 14.1
Prothrombin Time: 15.9 — ABNORMAL HIGH
Prothrombin Time: 19.8 — ABNORMAL HIGH
Prothrombin Time: 13.2
Prothrombin Time: 13.7
Prothrombin Time: 17.8 — ABNORMAL HIGH

## 2011-02-27 LAB — CK TOTAL AND CKMB (NOT AT ARMC)
CK, MB: 1.7
Relative Index: INVALID
Total CK: 62
Total CK: 64
Total CK: 72

## 2011-02-27 LAB — TROPONIN I
Troponin I: 0.03
Troponin I: 0.03

## 2011-02-27 LAB — CARDIAC PANEL(CRET KIN+CKTOT+MB+TROPI)
CK, MB: 1.5
Troponin I: 0.04

## 2011-02-27 LAB — POCT I-STAT 4, (NA,K, GLUC, HGB,HCT)
Glucose, Bld: 100 — ABNORMAL HIGH
Glucose, Bld: 108 — ABNORMAL HIGH
Glucose, Bld: 122 — ABNORMAL HIGH
Glucose, Bld: 91
HCT: 20 — ABNORMAL LOW
HCT: 42
Hemoglobin: 11.2 — ABNORMAL LOW
Hemoglobin: 7.1 — CL
Operator id: 203371
Operator id: 3402
Operator id: 3406
Potassium: 3.1 — ABNORMAL LOW
Potassium: 3.6
Potassium: 4.1
Potassium: 4.5
Sodium: 138

## 2011-02-27 LAB — I-STAT EC8
Acid-base deficit: 4 — ABNORMAL HIGH
Chloride: 104
Glucose, Bld: 141 — ABNORMAL HIGH
Hemoglobin: 12.2
Potassium: 3.9
Sodium: 138
TCO2: 24
pH, Arterial: 7.311 — ABNORMAL LOW

## 2011-02-27 LAB — CROSSMATCH: ABO/RH(D): O POS

## 2011-02-27 LAB — LIPID PANEL
Cholesterol: 213 — ABNORMAL HIGH
HDL: 60
Triglycerides: 71

## 2011-02-27 LAB — HEMOGLOBIN A1C
Hgb A1c MFr Bld: 6
Mean Plasma Glucose: 136

## 2011-02-27 LAB — BLOOD GAS, ARTERIAL
Acid-Base Excess: 3.7 — ABNORMAL HIGH
FIO2: 0.21
O2 Saturation: 98.1
Patient temperature: 98.6

## 2011-02-27 LAB — URINALYSIS, ROUTINE W REFLEX MICROSCOPIC
Bilirubin Urine: NEGATIVE
Hgb urine dipstick: NEGATIVE
Specific Gravity, Urine: 1.011
Urobilinogen, UA: 0.2
pH: 6

## 2011-02-27 LAB — B-NATRIURETIC PEPTIDE (CONVERTED LAB)
Pro B Natriuretic peptide (BNP): 688 — ABNORMAL HIGH
Pro B Natriuretic peptide (BNP): 1413 — ABNORMAL HIGH
Pro B Natriuretic peptide (BNP): 720 — ABNORMAL HIGH

## 2011-02-27 LAB — DIFFERENTIAL
Lymphs Abs: 2
Monocytes Relative: 8
Neutro Abs: 6.1
Neutrophils Relative %: 68

## 2011-02-27 LAB — HEMOGLOBIN AND HEMATOCRIT, BLOOD
HCT: 22 — ABNORMAL LOW
Hemoglobin: 7.6 — CL

## 2011-02-27 LAB — CREATININE, SERUM
Creatinine, Ser: 0.8
GFR calc Af Amer: >60
GFR calc non Af Amer: >60

## 2011-02-27 LAB — MAGNESIUM
Magnesium: 2.3
Magnesium: 2.9 — ABNORMAL HIGH

## 2011-02-27 LAB — APTT
aPTT: 35
aPTT: 38 — ABNORMAL HIGH

## 2011-02-27 LAB — POCT CARDIAC MARKERS
Myoglobin, poc: 64.4
Operator id: 216461
Troponin i, poc: <0.05

## 2011-02-27 LAB — DIGOXIN LEVEL: Digoxin Level: 1.6

## 2011-02-27 LAB — T4: T4, Total: 7.4

## 2011-03-22 DIAGNOSIS — G8191 Hemiplegia, unspecified affecting right dominant side: Secondary | ICD-10-CM

## 2011-03-22 DIAGNOSIS — I639 Cerebral infarction, unspecified: Secondary | ICD-10-CM

## 2011-03-22 HISTORY — DX: Cerebral infarction, unspecified: I63.9

## 2011-03-22 HISTORY — DX: Hemiplegia, unspecified affecting right dominant side: G81.91

## 2011-03-31 ENCOUNTER — Emergency Department (HOSPITAL_COMMUNITY): Payer: Medicare Other

## 2011-03-31 ENCOUNTER — Inpatient Hospital Stay (HOSPITAL_COMMUNITY)
Admission: EM | Admit: 2011-03-31 | Discharge: 2011-04-02 | DRG: 065 | Disposition: A | Discharge status: Home Health | Payer: Medicare Other | Attending: Internal Medicine | Admitting: Internal Medicine

## 2011-03-31 ENCOUNTER — Other Ambulatory Visit: Payer: Self-pay

## 2011-03-31 ENCOUNTER — Encounter: Payer: Self-pay | Admitting: *Deleted

## 2011-03-31 DIAGNOSIS — IMO0002 Reserved for concepts with insufficient information to code with codable children: Secondary | ICD-10-CM

## 2011-03-31 DIAGNOSIS — R001 Bradycardia, unspecified: Secondary | ICD-10-CM | POA: Diagnosis present

## 2011-03-31 DIAGNOSIS — G8190 Hemiplegia, unspecified affecting unspecified side: Secondary | ICD-10-CM | POA: Diagnosis present

## 2011-03-31 DIAGNOSIS — I6350 Cerebral infarction due to unspecified occlusion or stenosis of unspecified cerebral artery: Principal | ICD-10-CM | POA: Diagnosis present

## 2011-03-31 DIAGNOSIS — L03114 Cellulitis of left upper limb: Secondary | ICD-10-CM | POA: Diagnosis present

## 2011-03-31 DIAGNOSIS — G8191 Hemiplegia, unspecified affecting right dominant side: Secondary | ICD-10-CM | POA: Diagnosis present

## 2011-03-31 DIAGNOSIS — J8410 Pulmonary fibrosis, unspecified: Secondary | ICD-10-CM | POA: Diagnosis present

## 2011-03-31 DIAGNOSIS — S8010XA Contusion of unspecified lower leg, initial encounter: Secondary | ICD-10-CM | POA: Diagnosis present

## 2011-03-31 DIAGNOSIS — R8281 Pyuria: Secondary | ICD-10-CM | POA: Diagnosis present

## 2011-03-31 DIAGNOSIS — H409 Unspecified glaucoma: Secondary | ICD-10-CM | POA: Diagnosis present

## 2011-03-31 DIAGNOSIS — D649 Anemia, unspecified: Secondary | ICD-10-CM | POA: Diagnosis not present

## 2011-03-31 DIAGNOSIS — E785 Hyperlipidemia, unspecified: Secondary | ICD-10-CM | POA: Diagnosis present

## 2011-03-31 DIAGNOSIS — L02519 Cutaneous abscess of unspecified hand: Secondary | ICD-10-CM | POA: Diagnosis present

## 2011-03-31 DIAGNOSIS — Z8679 Personal history of other diseases of the circulatory system: Secondary | ICD-10-CM

## 2011-03-31 DIAGNOSIS — R0902 Hypoxemia: Secondary | ICD-10-CM | POA: Diagnosis present

## 2011-03-31 DIAGNOSIS — I639 Cerebral infarction, unspecified: Secondary | ICD-10-CM | POA: Diagnosis present

## 2011-03-31 DIAGNOSIS — I498 Other specified cardiac arrhythmias: Secondary | ICD-10-CM | POA: Diagnosis present

## 2011-03-31 DIAGNOSIS — Z954 Presence of other heart-valve replacement: Secondary | ICD-10-CM

## 2011-03-31 DIAGNOSIS — R471 Dysarthria and anarthria: Secondary | ICD-10-CM | POA: Diagnosis present

## 2011-03-31 HISTORY — DX: Cerebral infarction, unspecified: I63.9

## 2011-03-31 HISTORY — DX: Hyperlipidemia, unspecified: E78.5

## 2011-03-31 HISTORY — DX: Interstitial pulmonary disease, unspecified: J84.9

## 2011-03-31 HISTORY — DX: Nonrheumatic mitral (valve) insufficiency: I34.0

## 2011-03-31 HISTORY — DX: Unspecified atrial fibrillation: I48.91

## 2011-03-31 HISTORY — DX: Multiple fractures of ribs, unspecified side, initial encounter for closed fracture: S22.49XA

## 2011-03-31 HISTORY — DX: Pulmonary fibrosis, unspecified: J84.10

## 2011-03-31 HISTORY — DX: Hemiplegia, unspecified affecting right dominant side: G81.91

## 2011-03-31 HISTORY — DX: Hypoxemia: R09.02

## 2011-03-31 HISTORY — DX: Unspecified osteoarthritis, unspecified site: M19.90

## 2011-03-31 LAB — DIFFERENTIAL
Basophils Relative: 0 % (ref 0–1)
Eosinophils Absolute: 0.2 10*3/uL (ref 0.0–0.7)
Lymphs Abs: 1.2 10*3/uL (ref 0.7–4.0)
Monocytes Absolute: 0.6 10*3/uL (ref 0.1–1.0)
Monocytes Relative: 6 % (ref 3–12)
Neutro Abs: 8 10*3/uL — ABNORMAL HIGH (ref 1.7–7.7)

## 2011-03-31 LAB — URINALYSIS, ROUTINE W REFLEX MICROSCOPIC
Bilirubin Urine: NEGATIVE
Ketones, ur: NEGATIVE mg/dL
Nitrite: NEGATIVE
Protein, ur: NEGATIVE mg/dL
Urobilinogen, UA: 0.2 mg/dL (ref 0.0–1.0)
pH: 6 (ref 5.0–8.0)

## 2011-03-31 LAB — COMPREHENSIVE METABOLIC PANEL
Albumin: 3.3 g/dL — ABNORMAL LOW (ref 3.5–5.2)
Alkaline Phosphatase: 84 U/L (ref 39–117)
BUN: 17 mg/dL (ref 6–23)
Chloride: 101 mEq/L (ref 96–112)
Creatinine, Ser: 0.8 mg/dL (ref 0.50–1.10)
GFR calc Af Amer: 72 mL/min — ABNORMAL LOW (ref >90)
Glucose, Bld: 113 mg/dL — ABNORMAL HIGH (ref 70–99)
Potassium: 4.1 mEq/L (ref 3.5–5.1)
Total Bilirubin: 0.7 mg/dL (ref 0.3–1.2)

## 2011-03-31 LAB — CBC
HCT: 39.5 % (ref 36.0–46.0)
Hemoglobin: 12.7 g/dL (ref 12.0–15.0)
MCH: 29.9 pg (ref 26.0–34.0)
MCHC: 32.2 g/dL (ref 30.0–36.0)
RBC: 4.25 MIL/uL (ref 3.87–5.11)

## 2011-03-31 LAB — URINE MICROSCOPIC-ADD ON

## 2011-03-31 LAB — DIGOXIN LEVEL: Digoxin Level: 0.6 ng/mL — ABNORMAL LOW (ref 0.8–2.0)

## 2011-03-31 LAB — LACTIC ACID, PLASMA: Lactic Acid, Venous: 1.8 mmol/L (ref 0.5–2.2)

## 2011-03-31 MED ORDER — BRIMONIDINE TARTRATE-TIMOLOL 0.2-0.5 % OP SOLN
1.0000 [drp] | Freq: Two times a day (BID) | OPHTHALMIC | Status: DC
  Administered 2011-04-02 (×2): 1 [drp] via OPHTHALMIC
  Filled 2011-03-31: qty 1

## 2011-03-31 MED ORDER — FUROSEMIDE 20 MG PO TABS
20.0000 mg | ORAL_TABLET | Freq: Every day | ORAL | Status: DC
  Administered 2011-04-01 – 2011-04-02 (×2): 20 mg via ORAL
  Filled 2011-03-31 (×2): qty 1

## 2011-03-31 MED ORDER — ACETAMINOPHEN 325 MG PO TABS: 650.0000 mg | ORAL_TABLET | ORAL | Status: DC | PRN

## 2011-03-31 MED ORDER — ONDANSETRON HCL 4 MG/2ML IJ SOLN: 4.0000 mg | Freq: Four times a day (QID) | INTRAMUSCULAR | Status: DC | PRN

## 2011-03-31 MED ORDER — DIGOXIN 125 MCG PO TABS
125.0000 ug | ORAL_TABLET | Freq: Every day | ORAL | Status: DC
  Administered 2011-04-01 – 2011-04-02 (×2): 125 ug via ORAL
  Filled 2011-03-31 (×2): qty 1

## 2011-03-31 MED ORDER — ACETAMINOPHEN 650 MG RE SUPP: 650.0000 mg | RECTAL | Status: DC | PRN

## 2011-03-31 MED ORDER — BIMATOPROST 0.01 % OP SOLN
1.0000 [drp] | Freq: Every day | OPHTHALMIC | Status: DC
  Administered 2011-04-01: 1 [drp] via OPHTHALMIC
  Filled 2011-03-31: qty 5

## 2011-03-31 MED ORDER — CLOPIDOGREL BISULFATE 75 MG PO TABS
75.0000 mg | ORAL_TABLET | Freq: Every day | ORAL | Status: DC
  Administered 2011-04-01 – 2011-04-02 (×2): 75 mg via ORAL
  Filled 2011-03-31 (×2): qty 1

## 2011-03-31 MED ORDER — LEVOFLOXACIN IN D5W 500 MG/100ML IV SOLN
INTRAVENOUS | Status: AC
  Filled 2011-03-31: qty 100

## 2011-03-31 MED ORDER — SENNOSIDES-DOCUSATE SODIUM 8.6-50 MG PO TABS: 1.0000 | ORAL_TABLET | Freq: Every evening | ORAL | Status: DC | PRN

## 2011-03-31 MED ORDER — LEVOFLOXACIN IN D5W 500 MG/100ML IV SOLN
500.0000 mg | INTRAVENOUS | Status: DC
  Administered 2011-03-31 – 2011-04-01 (×2): 500 mg via INTRAVENOUS
  Filled 2011-03-31 (×2): qty 100

## 2011-03-31 MED ORDER — BRINZOLAMIDE 1 % OP SUSP
1.0000 [drp] | Freq: Two times a day (BID) | OPHTHALMIC | Status: DC
  Filled 2011-03-31: qty 10

## 2011-03-31 MED ORDER — ASPIRIN 81 MG PO CHEW
324.0000 mg | CHEWABLE_TABLET | Freq: Once | ORAL | Status: AC
  Administered 2011-03-31: 324 mg via ORAL
  Filled 2011-03-31: qty 4

## 2011-03-31 MED ORDER — SODIUM CHLORIDE 0.9 % IV SOLN
INTRAVENOUS | Status: DC
  Administered 2011-03-31 – 2011-04-02 (×3): via INTRAVENOUS

## 2011-03-31 MED ORDER — ENOXAPARIN SODIUM 40 MG/0.4ML ~~LOC~~ SOLN
40.0000 mg | SUBCUTANEOUS | Status: DC
  Administered 2011-03-31 – 2011-04-01 (×2): 40 mg via SUBCUTANEOUS
  Filled 2011-03-31 (×2): qty 0.4

## 2011-03-31 NOTE — ED Notes (Signed)
Pt states she does not want to go to [facility]. EDP aware and is in with pt now to discuss.
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
## 2011-03-31 NOTE — ED Notes (Signed)
Slurred speech sudden onset approx 1100 today, denies weakness/visual disturbances.  Positive right arm drift, no weakness noted to lower extremities.  Pt alert and oriented x 4.

## 2011-03-31 NOTE — ED Notes (Signed)
Soup given and grandson brought her food. Dr Sherrie Mustache in to assess.

## 2011-03-31 NOTE — ED Provider Notes (Signed)
Scribed for Gerhard Munch, MD, the patient was seen in room APA05/APA05. This chart was scribed by [employer]. The patient's care started at 11:53  CSN: [id number] Arrival date & time: 03/31/2011 11:48 AM   First MD Initiated Contact with Patient 03/31/11 1153      Chief Complaint  Patient presents with  . Code Stroke   HPI Tabitha [name] is a 77 y.o. female who presents to the Emergency Department complaining of Code Stroke. She reports a sudden onset of symptoms at about 11:00 today. States she was standing and trying to turn off the light switch when she noticed significant weakness in one arm. Denies a history of similar symptoms, light headedness, confusion or a history of strokes or TIA. Patient is awake, alert and oriented to person, place and time. Facial droop noted.  Past Medical History  Diagnosis Date  . Interstitial lung disease   . Coronary artery disease     Past Surgical History  Procedure Date  . Cardiac surgery     No family history on file.  History  Substance Use Topics  . Smoking status: Never Smoker   . Smokeless tobacco: Not on file  . Alcohol Use: Yes     wine approx 5 days/wk    OB History    Grav Para Term Preterm Abortions TAB SAB Ect Mult Living                  Review of Systems  Constitutional:       All other systems negative except as noted in the HPI  Neurological: Positive for facial asymmetry and weakness.  All other systems reviewed and are negative.    Allergies  Penicillins  Home Medications   Current Outpatient Rx  Name Route Sig Dispense Refill  . DIGOXIN 0.125 MG PO TABS Oral Take 1 tablet (125 mcg total) by mouth daily. 30 tablet 5    BP 139/100  Resp 16  SpO2 98%  Physical Exam  Nursing note and vitals reviewed. Constitutional: She is oriented to person, place, and time.       Awake, alert, nontoxic appearance with baseline speech for patient.  HENT:  Head: Atraumatic.  Mouth/Throat: No  oropharyngeal exudate.  Eyes: Conjunctivae and EOM are normal. Pupils are equal, round, and reactive to light. Right eye exhibits no discharge. Left eye exhibits no discharge.  Neck: Neck supple.  Cardiovascular: Normal rate and regular rhythm.   No murmur heard. Pulmonary/Chest: Effort normal and breath sounds normal. No stridor. No respiratory distress. She has no wheezes. She has no rales. She exhibits no tenderness.  Abdominal: Soft. Bowel sounds are normal. She exhibits no mass. There is no tenderness. There is no rebound.  Musculoskeletal: She exhibits no edema and no tenderness.       Baseline ROM, moves extremities with no obvious new focal weakness.  Lymphadenopathy:    She has no cervical adenopathy.  Neurological: She is alert and oriented to person, place, and time.       Awake, alert, cooperative and aware of situation; motor strength bilaterally; sensation normal to light touch bilaterally; peripheral visual fields full to confrontation  Facial droop noted  tongue midline;normal finger to nose bilaterally,  Skin: No rash noted. No erythema.  Psychiatric: She has a normal mood and affect.    ED Course  Procedures   DIAGNOSTIC STUDIES: Oxygen Saturation is 98% on [place], normal by my interpretation.    COORDINATION OF CARE: 11:55 - EDP examined  patient at bedside and ordered the following  Orders Placed This Encounter  Procedures  . CT Head Wo Contrast  . DG Chest 2 View  . CBC  . Differential  . Comprehensive metabolic panel  . Lactic acid, plasma  . Urinalysis, Routine w reflex microscopic  . Cardiac monitoring  . In and Out Cath    Results for orders placed during the hospital encounter of 03/31/11  CBC      Component Value Range   WBC 10.0  4.0 - 10.5 (K/uL)   RBC 4.25  3.87 - 5.11 (MIL/uL)   Hemoglobin 12.7  12.0 - 15.0 (g/dL)   HCT 95.6  21.3 - 08.6 (%)   MCV 92.9  78.0 - 100.0 (fL)   MCH 29.9  26.0 - 34.0 (pg)   MCHC 32.2  30.0 - 36.0 (g/dL)   RDW  57.8  46.9 - 62.9 (%)   Platelets 249  150 - 400 (K/uL)  DIFFERENTIAL      Component Value Range   Neutrophils Relative 80 (*) 43 - 77 (%)   Neutro Abs 8.0 (*) 1.7 - 7.7 (K/uL)   Lymphocytes Relative 12  12 - 46 (%)   Lymphs Abs 1.2  0.7 - 4.0 (K/uL)   Monocytes Relative 6  3 - 12 (%)   Monocytes Absolute 0.6  0.1 - 1.0 (K/uL)   Eosinophils Relative 2  0 - 5 (%)   Eosinophils Absolute 0.2  0.0 - 0.7 (K/uL)   Basophils Relative 0  0 - 1 (%)   Basophils Absolute 0.0  0.0 - 0.1 (K/uL)  COMPREHENSIVE METABOLIC PANEL      Component Value Range   Sodium 136  135 - 145 (mEq/L)   Potassium 4.1  3.5 - 5.1 (mEq/L)   Chloride 101  96 - 112 (mEq/L)   CO2 28  19 - 32 (mEq/L)   Glucose, Bld 113 (*) 70 - 99 (mg/dL)   BUN 17  6 - 23 (mg/dL)   Creatinine, Ser 5.28  0.50 - 1.10 (mg/dL)   Calcium 9.5  8.4 - 41.3 (mg/dL)   Total Protein 7.7  6.0 - 8.3 (g/dL)   Albumin 3.3 (*) 3.5 - 5.2 (g/dL)   AST 21  0 - 37 (U/L)   ALT 15  0 - 35 (U/L)   Alkaline Phosphatase 84  39 - 117 (U/L)   Total Bilirubin 0.7  0.3 - 1.2 (mg/dL)   GFR calc non Af Amer 63 (*) >90 (mL/min)   GFR calc Af Amer 72 (*) >90 (mL/min)  LACTIC ACID, PLASMA      Component Value Range   Lactic Acid, Venous 1.8  0.5 - 2.2 (mmol/L)  URINALYSIS, ROUTINE W REFLEX MICROSCOPIC      Component Value Range   Color, Urine YELLOW  YELLOW    Appearance HAZY (*) CLEAR    Specific Gravity, Urine 1.025  1.005 - 1.030    pH 6.0  5.0 - 8.0    Glucose, UA NEGATIVE  NEGATIVE (mg/dL)   Hgb urine dipstick NEGATIVE  NEGATIVE    Bilirubin Urine NEGATIVE  NEGATIVE    Ketones, ur NEGATIVE  NEGATIVE (mg/dL)   Protein, ur NEGATIVE  NEGATIVE (mg/dL)   Urobilinogen, UA 0.2  0.0 - 1.0 (mg/dL)   Nitrite NEGATIVE  NEGATIVE    Leukocytes, UA SMALL (*) NEGATIVE   URINE MICROSCOPIC-ADD ON      Component Value Range   Squamous Epithelial / LPF FEW (*) RARE  WBC, UA 3-6  <3 (WBC/hpf)   RBC / HPF 0-2  <3 (RBC/hpf)   Bacteria, UA MANY (*) RARE    Ct Head  Wo Contrast  03/31/2011  *RADIOLOGY REPORT*  Clinical Data: Code stroke, slurred speech.  CT HEAD WITHOUT CONTRAST  Technique:  Contiguous axial images were obtained from the base of the skull through the vertex without contrast.  Comparison: Head CT 07/30/2010  Findings: No acute intracranial hemorrhage.  No focal mass lesion. No CT evidence of acute infarction.   No midline shift or mass effect.  No hydrocephalus.  Basilar cisterns are patent.  There is periventricular subcortical white matter hypodensities unchanged from prior.  Generalized cortical atrophy.  Paranasal sinuses and mastoid air cells are clear.  Orbits are normal.  There is opacification of the left mastoid air cells which is unchanged from prior.  IMPRESSION:  1.  No CT evidence of acute infarction.  2.  No intracranial hemorrhage. 3.  Chronic opacification of the left mastoid air cells.  Critical results were conveyed to Dr. Jeraldine Loots on 03/31/2011 at 12:18 hours  Original Report Authenticated By: Genevive Bi, M.D.       MDM: This 77yo F presents via Stroke Code after the acute onset of hemi-weakness and facial droop w slurred speech ~1hr pta.  On arrival the patient's complaints decreased though she continued to have facial droop and mild arthralgia.  Initial CT was unremarkable, and labs not notable.  With the patient's high level of function, and the concern for the severity of this seeming tia, the patient was thought to benefit from an MR to characterize further her cerebrovascular disease, and initiate appropriate treatment. The patient deferred transferred to [facility] for this MRI. Dr. Malvin Johns, the patient's friend, acting as her advocate, notes that the patient's interests are better served by her not having an MRI, and simply started an antiplatelet agent. The patient was admitted to the hospitalist service, following provision of aspirin, and continued improvement of her neurologic deficits.  Although the patient  clearly could benefit from an MRI, particularly if there is an acute emboli that is amenable to embolectomy, her deferral of the study means that there to be started without having a clear indication, placing her at possible risk for possible adverse drug reactions, which may have been avoided if she had an MRI that was unremarkable. This was discussed with the patient and Dr. Malvin Johns, and the patient acknowledges this risk   Scribe Attestation I personally performed the services described in this documentation, which was scribed in my presence. The recorded information has been reviewed and considered.       Gerhard Munch, MD 04/01/11 [phone]

## 2011-04-01 ENCOUNTER — Encounter (HOSPITAL_COMMUNITY): Payer: Self-pay | Admitting: Internal Medicine

## 2011-04-01 DIAGNOSIS — E785 Hyperlipidemia, unspecified: Secondary | ICD-10-CM | POA: Diagnosis present

## 2011-04-01 HISTORY — DX: Hyperlipidemia, unspecified: E78.5

## 2011-04-01 LAB — COMPREHENSIVE METABOLIC PANEL
AST: 17 U/L (ref 0–37)
Albumin: 2.8 g/dL — ABNORMAL LOW (ref 3.5–5.2)
Alkaline Phosphatase: 69 U/L (ref 39–117)
BUN: 17 mg/dL (ref 6–23)
Chloride: 104 mEq/L (ref 96–112)
Potassium: 3.7 mEq/L (ref 3.5–5.1)
Sodium: 137 mEq/L (ref 135–145)
Total Bilirubin: 0.4 mg/dL (ref 0.3–1.2)
Total Protein: 6.3 g/dL (ref 6.0–8.3)

## 2011-04-01 LAB — CBC
HCT: 34.4 % — ABNORMAL LOW (ref 36.0–46.0)
MCH: 30 pg (ref 26.0–34.0)
MCV: 93 fL (ref 78.0–100.0)
Platelets: 229 10*3/uL (ref 150–400)
RBC: 3.7 MIL/uL — ABNORMAL LOW (ref 3.87–5.11)
RDW: 14.6 % (ref 11.5–15.5)
WBC: 6.7 10*3/uL (ref 4.0–10.5)

## 2011-04-01 LAB — T4, FREE: Free T4: 1.32 ng/dL (ref 0.80–1.80)

## 2011-04-01 LAB — HEMOGLOBIN A1C
Hgb A1c MFr Bld: 5.7 % — ABNORMAL HIGH (ref <5.7)
Mean Plasma Glucose: 117 mg/dL — ABNORMAL HIGH (ref <117)

## 2011-04-01 LAB — LIPID PANEL: LDL Cholesterol: 100 mg/dL — ABNORMAL HIGH (ref 0–99)

## 2011-04-01 LAB — TSH: TSH: 1.588 u[IU]/mL (ref 0.350–4.500)

## 2011-04-01 MED ORDER — SIMVASTATIN 10 MG PO TABS
5.0000 mg | ORAL_TABLET | Freq: Every day | ORAL | Status: DC
  Administered 2011-04-01: 5 mg via ORAL
  Filled 2011-04-01: qty 1

## 2011-04-01 NOTE — Progress Notes (Signed)
Surgery  Filed Vitals:   04/01/11 0500  BP: 105/68  Pulse: 65  Temp: 98 F (36.7 C)  Resp: 20    Pt seen in ER yesterday.  I have cared for her in past RE: surgical and some minor medical problems in past.  Pt likely had TIA and did not wish extraordinary measures or MRI.  Her wishes were  Known to me and I conveyed that to the ER MD.  Plans were for treatment with antiplatelet therapy only and she is fully aware of the risks of a possible repeat event and at 58 that is her wish.   Dr. Juanetta Gosling also been her MD in the past.    Cont. Treatment plan as per [facility].

## 2011-04-01 NOTE — Progress Notes (Signed)
Addendum:  Clinically there is no apparent facial droop.  She feels a little weakness during ambulation but no obvious lateralizing signs.  She should resume her home O2 which she stoped a while back.  ? Rehab/PT.

## 2011-04-01 NOTE — Progress Notes (Signed)
Pt ambulating halls well with one assist >40 feet.  Still experiencing some general weakness.  Up to chair for meals.

## 2011-04-01 NOTE — Progress Notes (Signed)
Subjective: The patient is sitting up eating breakfast. She had a good night. She denies difficulty chewing or swallowing. She believes that she is getting some strength back in her right hand.  Objective: Vital signs in last 24 hours: Filed Vitals:   03/31/11 2115 03/31/11 2327 04/01/11 0130 04/01/11 0500  BP: 107/67 124/69 111/72 105/68  Pulse: 73 66 68 65  Temp: 97.2 F (36.2 C) 98.1 F (36.7 C) 97.6 F (36.4 C) 98 F (36.7 C)  TempSrc:  Oral    Resp: 16 20 20 20   Height:      Weight:      SpO2: 97% 97% 96% 98%    Intake/Output Summary (Last 24 hours) at 04/01/11 1102 Last data filed at 04/01/11 0838  Gross per 24 hour  Intake 989.17 ml  Output    101 ml  Net 888.17 ml    Weight change:   Exam: Lungs: Clear to auscultation bilaterally. Heart: S1, S2, with ectopy. Abdomen: Positive bowel sounds, soft, nondistended, nontender. Extremities: No pedal edema. The left volar surface of her distal arm has some moderate edema, significantly less hand erythema, minimally tender. Radial pulses present. Neurologic: Significantly less right facial droop. Less dysarthria. She continues to have a mild right pronator drift. Sensation grossly intact bilaterally.    Lab Results: Basic Metabolic Panel:  Basename 04/01/11 0443 03/31/11 1215  NA 137 136  K 3.7 4.1  CL 104 101  CO2 29 28  GLUCOSE 84 113*  BUN 17 17  CREATININE 0.77 0.80  CALCIUM 8.6 9.5  MG -- --  PHOS -- --   Liver Function Tests:  [facility] 04/01/11 0443 03/31/11 1215  AST 17 21  ALT 11 15  ALKPHOS 69 84  BILITOT 0.4 0.7  PROT 6.3 7.7  ALBUMIN 2.8* 3.3*   No results found for this basename: LIPASE:2,AMYLASE:2 in the last 72 hours No results found for this basename: AMMONIA:2 in the last 72 hours CBC:  Basename 04/01/11 0443 03/31/11 1215  WBC 6.7 10.0  NEUTROABS -- 8.0*  HGB 11.1* 12.7  HCT 34.4* 39.5  MCV 93.0 92.9  PLT 229 249   Cardiac Enzymes: No results found for this basename:  CKTOTAL:3,CKMB:3,CKMBINDEX:3,TROPONINI:3 in the last 72 hours BNP: No results found for this basename: POCBNP:3 in the last 72 hours D-Dimer: No results found for this basename: DDIMER:2 in the last 72 hours CBG: No results found for this basename: GLUCAP:6 in the last 72 hours Hemoglobin A1C: No results found for this basename: HGBA1C in the last 72 hours Fasting Lipid Panel:  Basename 04/01/11 0443  CHOL 165  HDL 52  LDLCALC 100*  TRIG 66  CHOLHDL 3.2  LDLDIRECT --   Thyroid Function Tests: No results found for this basename: TSH,T4TOTAL,FREET4,T3FREE,THYROIDAB in the last 72 hours Anemia Panel: No results found for this basename: VITAMINB12,FOLATE,FERRITIN,TIBC,IRON,RETICCTPCT in the last 72 hours Coagulation: No results found for this basename: LABPROT:2,INR:2 in the last 72 hours Urine Drug Screen:  Alcohol Level: No results found for this basename: ETH:2 in the last 72 hours Urinalysis:  Misc. Labs:  Micro: No results found for this or any previous visit (from the past 240 hour(s)).  Studies/Results: Ct Head Wo Contrast  03/31/2011  *RADIOLOGY REPORT*  Clinical Data: Code stroke, slurred speech.  CT HEAD WITHOUT CONTRAST  Technique:  Contiguous axial images were obtained from the base of the skull through the vertex without contrast.  Comparison: Head CT 07/30/2010  Findings: No acute intracranial hemorrhage.  No focal mass  lesion. No CT evidence of acute infarction.   No midline shift or mass effect.  No hydrocephalus.  Basilar cisterns are patent.  There is periventricular subcortical white matter hypodensities unchanged from prior.  Generalized cortical atrophy.  Paranasal sinuses and mastoid air cells are clear.  Orbits are normal.  There is opacification of the left mastoid air cells which is unchanged from prior.  IMPRESSION:  1.  No CT evidence of acute infarction.  2.  No intracranial hemorrhage. 3.  Chronic opacification of the left mastoid air cells.  Critical  results were conveyed to Dr. Jeraldine Loots on 03/31/2011 at 12:18 hours  Original Report Authenticated By: Genevive Bi, M.D.   Dg Chest Portable 1 View  03/31/2011  *RADIOLOGY REPORT*  Clinical Data: Altered mental status  PORTABLE CHEST - 1 VIEW  Comparison: CT chest dated 12/13/2008.  Chest radiograph dated 11/11/2008.  Findings: Extensive coarse interstitial markings with subpleural fibrosis.  No superimposed opacities suspicious for pneumonia.  The heart is top normal in size.  Valve annuloplasty.  IMPRESSION: Extensive interstitial markings/fibrosis, compatible with chronic interstitial lung disease.  No superimposed opacities suspicious for pneumonia.  Original Report Authenticated By: Charline Bills, M.D.    Medications: I have reviewed the patient's current medications.  Assessment: Principal Problem:  *Stroke, acute, thrombotic Active Problems:  Dysarthria due to cerebrovascular accident  Right hemiparesis  Bradycardia  Pyuria  Glaucoma  History of atrial fibrillation  Traumatic ecchymosis of lower leg  Cellulitis of left hand  Pulmonary fibrosis  1. Clinical diagnosis of acute left brain stroke with right-sided hemiparesis and dysarthria. The patient's deficits appear to be resolving. She is doing surprisingly well. Aspirin was discontinued in favor of Plavix.  Cellulitis of the left hand/arm. Levaquin has been started. There is less erythema. There is still some swelling over the volar surface of her distal arm.  Cardiac ectopy, per exam. The patient has a history of atrial fibrillation, that apparently, resolved following the mitral valve repair. However I will order another EKG to reevaluate this.  Chronic pulmonary fibrosis. Currently stable.  Mild dyslipidemia. Under the circumstances of an acute stroke, Zocor will be started at a small dose.  Mild dilutional anemia. This will be followed. Plan: We'll continue current treatment. Physical therapy and occupational  therapy consultations are pending. 2-D echocardiogram and carotid ultrasound are pending. We'll start Zocor at 5 mg each bedtime. Warm compresses to the left hand and arm when necessary. Hopefully discharge to home tomorrow depending on the therapist's recommendations.   LOS: 1 day   Cosmo Tetreault 04/01/2011, 11:02 AM

## 2011-04-02 ENCOUNTER — Inpatient Hospital Stay (HOSPITAL_COMMUNITY): Payer: Medicare Other

## 2011-04-02 ENCOUNTER — Encounter (HOSPITAL_COMMUNITY): Payer: Self-pay | Admitting: Internal Medicine

## 2011-04-02 DIAGNOSIS — R0902 Hypoxemia: Secondary | ICD-10-CM

## 2011-04-02 DIAGNOSIS — I059 Rheumatic mitral valve disease, unspecified: Secondary | ICD-10-CM

## 2011-04-02 HISTORY — DX: Hypoxemia: R09.02

## 2011-04-02 MED ORDER — CLOPIDOGREL BISULFATE 75 MG PO TABS: 75.0000 mg | ORAL_TABLET | Freq: Every day | ORAL | Status: DC

## 2011-04-02 MED ORDER — LEVOFLOXACIN 500 MG PO TABS: 500.0000 mg | ORAL_TABLET | Freq: Every day | ORAL | Status: AC

## 2011-04-02 MED ORDER — SIMVASTATIN 5 MG PO TABS: 5.0000 mg | ORAL_TABLET | Freq: Every day | ORAL | Status: DC

## 2011-04-02 NOTE — Progress Notes (Signed)
*  PRELIMINARY RESULTS* Echocardiogram 2D Echocardiogram has been performed.  [place] 04/02/2011, 10:21 AM
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
## 2011-04-02 NOTE — Discharge Summary (Signed)
Physician Discharge Summary  Tabitha [name] MRN: [id number] DOB/AGE: 01/10/1918 77 y.o.  PCP: Marlane Hatcher, MD   Admit date: 03/31/2011 Discharge date: 04/02/2011  Discharge Diagnoses:  1. Acute stroke, probable left brain. Clinical diagnosis. 2. Mild dysarthria and right-sided paresis. 3. Chronic interstitial lung disease/pulmonary fibrosis, chronic. 4. Hypoxia secondary to pulmonary fibrosis. Home oxygen was prescribed prior to discharge. 5. Mild dyslipidemia. Statin started in the setting of an acute stroke. 6. Sinus arrhythmia and occasional bradycardia. 7. Mild cellulitis of the left arm/hand. 8. Status post mitral valve replacement in 2008. 9. Glaucoma. 10. Mild dilutional anemia.    Current Discharge Medication List    START taking these medications   Details  clopidogrel (PLAVIX) 75 MG tablet Take 1 tablet (75 mg total) by mouth daily with breakfast. Qty: 30 tablet, Refills: 2    levofloxacin (LEVAQUIN) 500 MG tablet Take 1 tablet (500 mg total) by mouth daily. Qty: 4 tablet, Refills: 0    simvastatin (ZOCOR) 5 MG tablet Take 1 tablet (5 mg total) by mouth daily at 6 PM. Qty: 30 tablet, Refills: 2  Home oxygen, 3 L per minute.     CONTINUE these medications which have NOT CHANGED   Details  bimatoprost (LUMIGAN) 0.01 % SOLN Place 1 drop into both eyes at bedtime.      brimonidine-timolol (COMBIGAN) 0.2-0.5 % ophthalmic solution Place 1 drop into both eyes every 12 (twelve) hours.      brinzolamide (AZOPT) 1 % ophthalmic suspension Place 1 drop into both eyes 2 (two) times daily. Receives sample from doctor     digoxin (LANOXIN) 0.125 MG tablet Take 1 tablet (125 mcg total) by mouth daily. Qty: 30 tablet, Refills: 5    furosemide (LASIX) 20 MG tablet Take 20 mg by mouth daily.          Discharge Condition: Improved and stable.  Disposition: Home or Self Care   Consults: None.   Significant Diagnostic Studies: Ct Head Wo  Contrast  03/31/2011  *RADIOLOGY REPORT*  Clinical Data: Code stroke, slurred speech.  CT HEAD WITHOUT CONTRAST  Technique:  Contiguous axial images were obtained from the base of the skull through the vertex without contrast.  Comparison: Head CT 07/30/2010  Findings: No acute intracranial hemorrhage.  No focal mass lesion. No CT evidence of acute infarction.   No midline shift or mass effect.  No hydrocephalus.  Basilar cisterns are patent.  There is periventricular subcortical white matter hypodensities unchanged from prior.  Generalized cortical atrophy.  Paranasal sinuses and mastoid air cells are clear.  Orbits are normal.  There is opacification of the left mastoid air cells which is unchanged from prior.  IMPRESSION:  1.  No CT evidence of acute infarction.  2.  No intracranial hemorrhage. 3.  Chronic opacification of the left mastoid air cells.  Critical results were conveyed to Dr. Jeraldine Loots on 03/31/2011 at 12:18 hours  Original Report Authenticated By: Genevive Bi, M.D.   Dg Chest Portable 1 View  03/31/2011  *RADIOLOGY REPORT*  Clinical Data: Altered mental status  PORTABLE CHEST - 1 VIEW  Comparison: CT chest dated 12/13/2008.  Chest radiograph dated 11/11/2008.  Findings: Extensive coarse interstitial markings with subpleural fibrosis.  No superimposed opacities suspicious for pneumonia.  The heart is top normal in size.  Valve annuloplasty.  IMPRESSION: Extensive interstitial markings/fibrosis, compatible with chronic interstitial lung disease.  No superimposed opacities suspicious for pneumonia.  Original Report Authenticated By: Charline Bills, M.D.   ECHO: Pending  at discharge. OTHER PROCEDURES: Carotid ultrasound: Pending at discharge.  Microbiology: No results found for this or any previous visit (from the past 240 hour(s)).   Labs: Results for orders placed during the hospital encounter of 03/31/11 (from the past 48 hour(s))  HEMOGLOBIN A1C     Status: Abnormal    Collection Time   04/01/11  4:43 AM      Component Value Range Comment   Hemoglobin A1C 5.7 (*) <5.7 (%)    Mean Plasma Glucose 117 (*) <117 (mg/dL)   LIPID PANEL     Status: Abnormal   Collection Time   04/01/11  4:43 AM      Component Value Range Comment   Cholesterol 165  0 - 200 (mg/dL)    Triglycerides 66  <914 (mg/dL)    HDL 52  >78 (mg/dL)    Total CHOL/HDL Ratio 3.2      VLDL 13  0 - 40 (mg/dL)    LDL Cholesterol 295 (*) 0 - 99 (mg/dL)   TSH     Status: Normal   Collection Time   04/01/11  4:43 AM      Component Value Range Comment   TSH 1.588  0.350 - 4.500 (uIU/mL)   T4, FREE     Status: Normal   Collection Time   04/01/11  4:43 AM      Component Value Range Comment   Free T4 1.32  0.80 - 1.80 (ng/dL)   COMPREHENSIVE METABOLIC PANEL     Status: Abnormal   Collection Time   04/01/11  4:43 AM      Component Value Range Comment   Sodium 137  135 - 145 (mEq/L)    Potassium 3.7  3.5 - 5.1 (mEq/L)    Chloride 104  96 - 112 (mEq/L)    CO2 29  19 - 32 (mEq/L)    Glucose, Bld 84  70 - 99 (mg/dL)    BUN 17  6 - 23 (mg/dL)    Creatinine, Ser 6.21  0.50 - 1.10 (mg/dL)    Calcium 8.6  8.4 - 10.5 (mg/dL)    Total Protein 6.3  6.0 - 8.3 (g/dL)    Albumin 2.8 (*) 3.5 - 5.2 (g/dL)    AST 17  0 - 37 (U/L)    ALT 11  0 - 35 (U/L)    Alkaline Phosphatase 69  39 - 117 (U/L)    Total Bilirubin 0.4  0.3 - 1.2 (mg/dL)    GFR calc non Af Amer 71 (*) >90 (mL/min)    GFR calc Af Amer 83 (*) >90 (mL/min)   CBC     Status: Abnormal   Collection Time   04/01/11  4:43 AM      Component Value Range Comment   WBC 6.7  4.0 - 10.5 (K/uL)    RBC 3.70 (*) 3.87 - 5.11 (MIL/uL)    Hemoglobin 11.1 (*) 12.0 - 15.0 (g/dL)    HCT 30.8 (*) 65.7 - 46.0 (%)    MCV 93.0  78.0 - 100.0 (fL)    MCH 30.0  26.0 - 34.0 (pg)    MCHC 32.3  30.0 - 36.0 (g/dL)    RDW 84.6  96.2 - 95.2 (%)    Platelets 229  150 - 400 (K/uL)      HPI : The patient is a very pleasant 77 year old woman with a past medical  history significant for pulmonary fibrosis, glaucoma, and status post mitral valve repair. She presented to the  emergency department on 03/31/2011 with a chief complaint of acute left arm weakness, slurred speech, and a right facial droop. A code stroke was called by the emergency department physician. However, the patient refused an MRI and any other aggressive workup. Her primary care physician, Dr. Malvin Johns, discussed management options with the patient. She preferred to be evaluated and managed at [facility]. In the emergency department, the CT scan of her head revealed no acute infarctions. Her EKG revealed PACs, right axis deviation, nonspecific ST abnormalities, and digitalis effects. Her venous glucose was mildly elevated at 113. Her urinalysis revealed 3-6 WBCs, however, she had no complaints of dysuria. She was afebrile and hemodynamically stable. She was admitted for further evaluation and management.  HOSPITAL COURSE: The patient was evaluated in the emergency department with a bedside swallow evaluation. She passed the evaluation. According to the nursing staff in the emergency department reported that her right facial droop and dysarthria had subsided some during the stay in the emergency department. She was given 4 baby aspirin. She actually takes a 325 mg aspirin daily. It was discontinued in favor of Plavix. She also complained of left distal arm and hand swelling and redness which she attributed to something biting her. She was started on Levaquin empirically for cellulitis. All of her other chronic medications were continued. A digoxin level was assessed and it was slightly subtherapeutic at 0.6. She was maintained on her usual dose.  For further evaluation, fasting lipid profile, 2-D echocardiogram, carotid ultrasound, hemoglobin A1c, and TSH/free T4 were ordered. Her fasting lipid profile revealed a total cholesterol of 165, triglycerides of 66, HDL cholesterol 52, and LDL  cholesterol of 100. In the setting of an acute stroke, a small dose of Zocor was started at 5 mg each bedtime. Her TSH was within normal limits at 1.58 and her free T4 was within normal limits at 1.32. Her hemoglobin A1c was within normal limits at 5.7. The results of her 2-D echocardiogram and carotid ultrasound were pending. The results will be followed up upon.  On exam, the patient was noted to have cardiac ectopy. She had a remote history of atrophic ablation prior to the mitral valve repair. Her EKG on admission revealed sinus rhythm with PACs and a heart rate of 72 per minute. Her followup chest x-ray revealed virtually the same with sinus rhythm with PACs, right axis deviation, nonspecific ST and T wave abnormalities, and a heart rate of 75 beats per minute. In review of her EKG,  She did have P waves. She has an appointment to followup with her cardiologist, Dr. Daleen Squibb, in December.  The physical and occupational therapists evaluated the patient. They recommended home health physical therapy only. The patient believes that she was close to baseline. She remained hemodynamically stable and afebrile. The cellulitis and edema of her left wrist/hand subsided. She was discharged on 4 more days of Levaquin. She was discharged to home in no acute distress.  Of note, during the ambulatory part of the physical therapist's evaluation, the patient desaturated to the 80s. She has a history of pulmonary fibrosis and apparently, her pulmonologist at [facility], had suggested home oxygen in the past. She admitted to not being on oxygen at home. Prior to discharge, home oxygen was ordered and prescribed for 3 L per minute.    Discharge Exam: Blood pressure 132/76, pulse 77, temperature 97.6 F (36.4 C), temperature source Oral, resp. rate 20, height 5\' 3"  (1.6 m), weight 48 kg (105 lb 13.1 oz),  SpO2 89.00%. Lungs: Occasional fine crackles. Heart: S1, S2, with ectopic beats. Abdomen: Positive bowel sounds, soft,  nontender, nondistended. Extremities: No pedal edema. Neurologic: Trace of right facial droop, mild dysarthria, mild right pronator drift.    Discharge Orders    Future Appointments: Provider: Department: Dept Phone: Center:   05/02/2011 10:45 AM Valera Castle, MD [facility] [phone] [id number]      Follow-up Information    Follow up with BRADFORD,WILLIAM S on 04/09/2011. (AT 10:30 AM)    Contact information:   [address] [place] [address] [phone]          Signed: Luigi Stuckey 04/02/2011, 1:01 PM

## 2011-04-02 NOTE — Progress Notes (Signed)
Filed Vitals:   04/02/11 1000  BP: 132/76  Pulse: 77  Temp: 97.6 F (36.4 C)  Resp: 20   No acute neuro changes.  Discharge and follow up arranged.

## 2011-04-02 NOTE — Progress Notes (Signed)
Discharge instructions and prescriptions given, verbalized understanding, out via wheelchair in stable condition with staff, pt refused to wear oxygen to transport, oxygen tank sent with patient.
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
## 2011-04-13 ENCOUNTER — Ambulatory Visit: Payer: Medicare Other | Admitting: Cardiology

## 2011-05-02 ENCOUNTER — Encounter: Payer: Self-pay | Admitting: Cardiology

## 2011-05-02 ENCOUNTER — Ambulatory Visit (INDEPENDENT_AMBULATORY_CARE_PROVIDER_SITE_OTHER): Payer: Medicare Other | Admitting: Cardiology

## 2011-05-02 VITALS — BP 112/62 | HR 58 | Ht 63.0 in | Wt 101.0 lb

## 2011-05-02 DIAGNOSIS — Z8679 Personal history of other diseases of the circulatory system: Secondary | ICD-10-CM

## 2011-05-02 DIAGNOSIS — I2510 Atherosclerotic heart disease of native coronary artery without angina pectoris: Secondary | ICD-10-CM

## 2011-05-02 DIAGNOSIS — I080 Rheumatic disorders of both mitral and aortic valves: Secondary | ICD-10-CM

## 2011-05-02 DIAGNOSIS — I498 Other specified cardiac arrhythmias: Secondary | ICD-10-CM

## 2011-05-02 DIAGNOSIS — I639 Cerebral infarction, unspecified: Secondary | ICD-10-CM

## 2011-05-02 DIAGNOSIS — M810 Age-related osteoporosis without current pathological fracture: Secondary | ICD-10-CM

## 2011-05-02 DIAGNOSIS — I501 Left ventricular failure: Secondary | ICD-10-CM

## 2011-05-02 DIAGNOSIS — R001 Bradycardia, unspecified: Secondary | ICD-10-CM

## 2011-05-02 NOTE — Assessment & Plan Note (Signed)
Stable. Patient reassured that no indication for surgery nor will ever be

## 2011-05-02 NOTE — Assessment & Plan Note (Signed)
Stable. No change in medical therapy. 
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
## 2011-05-02 NOTE — Patient Instructions (Signed)
Your physician recommends that you schedule a follow-up appointment in: 12 months.  

## 2011-05-02 NOTE — Assessment & Plan Note (Signed)
Continue Plavix.  ?

## 2011-05-02 NOTE — Assessment & Plan Note (Signed)
Stable. Asymptomatic. No change in treatment.

## 2011-05-02 NOTE — Assessment & Plan Note (Signed)
By exam, she may be in atrial fib. She is asymptomatic and rate is well controlled. Continue Plavix. She is not Coumadin candidate.

## 2011-05-02 NOTE — Assessment & Plan Note (Signed)
Stable. Continue medical therapy 

## 2011-05-09 ENCOUNTER — Encounter: Payer: Self-pay | Admitting: Cardiology

## 2011-05-30 ENCOUNTER — Other Ambulatory Visit: Payer: Self-pay | Admitting: *Deleted

## 2011-05-30 MED ORDER — FUROSEMIDE 20 MG PO TABS: 20.0000 mg | ORAL_TABLET | Freq: Every day | ORAL | Status: DC

## 2011-08-07 ENCOUNTER — Ambulatory Visit (HOSPITAL_COMMUNITY): Payer: Medicare Other

## 2011-08-09 ENCOUNTER — Encounter (HOSPITAL_COMMUNITY)
Admission: RE | Admit: 2011-08-09 | Discharge: 2011-08-09 | Disposition: A | Discharge status: Home / Self Care | Payer: Medicare Other | Source: Ambulatory Visit | Attending: Pulmonary Disease | Admitting: Pulmonary Disease

## 2011-08-09 ENCOUNTER — Encounter (HOSPITAL_COMMUNITY): Payer: Self-pay

## 2011-08-09 DIAGNOSIS — I2510 Atherosclerotic heart disease of native coronary artery without angina pectoris: Secondary | ICD-10-CM | POA: Insufficient documentation

## 2011-08-09 DIAGNOSIS — I059 Rheumatic mitral valve disease, unspecified: Secondary | ICD-10-CM | POA: Insufficient documentation

## 2011-08-09 DIAGNOSIS — I509 Heart failure, unspecified: Secondary | ICD-10-CM | POA: Insufficient documentation

## 2011-08-09 NOTE — Patient Instructions (Signed)
Pt has finished orientation and is scheduled to start CR on 08/13/11 at 1pm. Pt has been instructed to arrive to class 15 minutes early for scheduled class. Pt has been instructed to wear comfortable clothing and shoes with rubber soles. Pt has been told to take their medications 1 hour prior to coming to class.  If the patient is not going to attend class, he/she has been instructed to call.

## 2011-08-09 NOTE — Progress Notes (Signed)
During orientation advised patient on arrival and appointment times what to wear, what to do before, during and after exercise. Reviewed attendance and class policy. Talked about inclement weather and class consultation policy. Pt is scheduled to start Pulmonary Rehab on 08/13/11 at 1pm. Pt was advised to come to class 5 minutes before class starts. He was also given instructions on meeting with the dietician and attending the Family Structure classes. Pt is eager to get started.

## 2011-08-13 ENCOUNTER — Encounter (HOSPITAL_COMMUNITY)
Admission: RE | Admit: 2011-08-13 | Discharge: 2011-08-13 | Disposition: A | Discharge status: Home / Self Care | Payer: Medicare Other | Source: Ambulatory Visit | Attending: Pulmonary Disease | Admitting: Pulmonary Disease

## 2011-08-15 ENCOUNTER — Encounter (HOSPITAL_COMMUNITY)
Admission: RE | Admit: 2011-08-15 | Discharge: 2011-08-15 | Disposition: A | Discharge status: Home / Self Care | Payer: Medicare Other | Source: Ambulatory Visit | Attending: Pulmonary Disease | Admitting: Pulmonary Disease

## 2011-08-16 ENCOUNTER — Ambulatory Visit (INDEPENDENT_AMBULATORY_CARE_PROVIDER_SITE_OTHER): Payer: Medicare Other | Admitting: Otolaryngology

## 2011-08-16 DIAGNOSIS — H6120 Impacted cerumen, unspecified ear: Secondary | ICD-10-CM

## 2011-08-17 ENCOUNTER — Encounter (HOSPITAL_COMMUNITY): Payer: Medicare Other

## 2011-08-20 ENCOUNTER — Encounter (HOSPITAL_COMMUNITY): Payer: Medicare Other

## 2011-08-22 ENCOUNTER — Encounter (HOSPITAL_COMMUNITY): Payer: Medicare Other

## 2011-08-24 ENCOUNTER — Encounter (HOSPITAL_COMMUNITY): Payer: Medicare Other

## 2011-08-27 ENCOUNTER — Encounter (HOSPITAL_COMMUNITY)
Admission: RE | Admit: 2011-08-27 | Discharge: 2011-08-27 | Disposition: A | Discharge status: Home / Self Care | Payer: Medicare Other | Source: Ambulatory Visit | Attending: Pulmonary Disease | Admitting: Pulmonary Disease

## 2011-08-27 DIAGNOSIS — I509 Heart failure, unspecified: Secondary | ICD-10-CM | POA: Insufficient documentation

## 2011-08-27 DIAGNOSIS — I2510 Atherosclerotic heart disease of native coronary artery without angina pectoris: Secondary | ICD-10-CM | POA: Insufficient documentation

## 2011-08-27 DIAGNOSIS — I059 Rheumatic mitral valve disease, unspecified: Secondary | ICD-10-CM | POA: Insufficient documentation

## 2011-08-29 ENCOUNTER — Encounter (HOSPITAL_COMMUNITY)
Admission: RE | Admit: 2011-08-29 | Discharge: 2011-08-29 | Disposition: A | Discharge status: Home / Self Care | Payer: Medicare Other | Source: Ambulatory Visit | Attending: Pulmonary Disease | Admitting: Pulmonary Disease

## 2011-08-31 ENCOUNTER — Encounter (HOSPITAL_COMMUNITY): Payer: Medicare Other

## 2011-09-03 ENCOUNTER — Encounter (HOSPITAL_COMMUNITY): Payer: Medicare Other

## 2011-09-05 ENCOUNTER — Encounter (HOSPITAL_COMMUNITY): Payer: Medicare Other

## 2011-09-07 ENCOUNTER — Encounter (HOSPITAL_COMMUNITY): Payer: Medicare Other

## 2011-09-10 ENCOUNTER — Encounter (HOSPITAL_COMMUNITY): Payer: Medicare Other

## 2011-09-12 ENCOUNTER — Encounter (HOSPITAL_COMMUNITY)
Admission: RE | Admit: 2011-09-12 | Discharge: 2011-09-12 | Disposition: A | Discharge status: Home / Self Care | Payer: Medicare Other | Source: Ambulatory Visit | Attending: Pulmonary Disease | Admitting: Pulmonary Disease

## 2011-09-14 ENCOUNTER — Encounter (HOSPITAL_COMMUNITY): Payer: Medicare Other

## 2011-09-17 ENCOUNTER — Encounter (HOSPITAL_COMMUNITY)
Admission: RE | Admit: 2011-09-17 | Discharge: 2011-09-17 | Disposition: A | Discharge status: Home / Self Care | Payer: Medicare Other | Source: Ambulatory Visit | Attending: Pulmonary Disease | Admitting: Pulmonary Disease

## 2011-09-17 ENCOUNTER — Other Ambulatory Visit: Payer: Self-pay | Admitting: Cardiology

## 2011-09-19 ENCOUNTER — Encounter (HOSPITAL_COMMUNITY)
Admission: RE | Admit: 2011-09-19 | Discharge: 2011-09-19 | Disposition: A | Discharge status: Home / Self Care | Payer: Medicare Other | Source: Ambulatory Visit | Attending: Pulmonary Disease | Admitting: Pulmonary Disease

## 2011-09-19 DIAGNOSIS — I059 Rheumatic mitral valve disease, unspecified: Secondary | ICD-10-CM | POA: Insufficient documentation

## 2011-09-19 DIAGNOSIS — I2510 Atherosclerotic heart disease of native coronary artery without angina pectoris: Secondary | ICD-10-CM | POA: Insufficient documentation

## 2011-09-19 DIAGNOSIS — I509 Heart failure, unspecified: Secondary | ICD-10-CM | POA: Insufficient documentation

## 2011-09-21 ENCOUNTER — Encounter (HOSPITAL_COMMUNITY): Payer: Medicare Other

## 2011-09-24 ENCOUNTER — Encounter (HOSPITAL_COMMUNITY)
Admission: RE | Admit: 2011-09-24 | Discharge: 2011-09-24 | Disposition: A | Discharge status: Home / Self Care | Payer: Medicare Other | Source: Ambulatory Visit | Attending: Pulmonary Disease | Admitting: Pulmonary Disease

## 2011-09-26 ENCOUNTER — Encounter (HOSPITAL_COMMUNITY)
Admission: RE | Admit: 2011-09-26 | Discharge: 2011-09-26 | Disposition: A | Discharge status: Home / Self Care | Payer: Medicare Other | Source: Ambulatory Visit | Attending: Pulmonary Disease | Admitting: Pulmonary Disease

## 2011-09-28 ENCOUNTER — Encounter (HOSPITAL_COMMUNITY): Payer: Medicare Other

## 2011-10-01 ENCOUNTER — Encounter (HOSPITAL_COMMUNITY): Payer: Medicare Other

## 2011-10-03 ENCOUNTER — Encounter (HOSPITAL_COMMUNITY)
Admission: RE | Admit: 2011-10-03 | Discharge: 2011-10-03 | Disposition: A | Discharge status: Home / Self Care | Payer: Medicare Other | Source: Ambulatory Visit | Attending: Pulmonary Disease | Admitting: Pulmonary Disease

## 2011-10-05 ENCOUNTER — Encounter (HOSPITAL_COMMUNITY): Payer: Medicare Other

## 2011-10-08 ENCOUNTER — Encounter (HOSPITAL_COMMUNITY)
Admission: RE | Admit: 2011-10-08 | Discharge: 2011-10-08 | Disposition: A | Discharge status: Home / Self Care | Payer: Medicare Other | Source: Ambulatory Visit | Attending: Pulmonary Disease | Admitting: Pulmonary Disease

## 2011-10-10 ENCOUNTER — Encounter (HOSPITAL_COMMUNITY): Payer: Medicare Other

## 2011-10-12 ENCOUNTER — Encounter (HOSPITAL_COMMUNITY): Payer: Medicare Other

## 2011-10-15 ENCOUNTER — Encounter (HOSPITAL_COMMUNITY): Payer: Medicare Other

## 2011-10-17 ENCOUNTER — Encounter (HOSPITAL_COMMUNITY): Payer: Medicare Other

## 2011-10-19 ENCOUNTER — Encounter (HOSPITAL_COMMUNITY): Payer: Medicare Other

## 2011-10-22 ENCOUNTER — Encounter (HOSPITAL_COMMUNITY)
Admission: RE | Admit: 2011-10-22 | Discharge: 2011-10-22 | Disposition: A | Discharge status: Home / Self Care | Payer: Medicare Other | Source: Ambulatory Visit | Attending: Pulmonary Disease | Admitting: Pulmonary Disease

## 2011-10-22 DIAGNOSIS — I2510 Atherosclerotic heart disease of native coronary artery without angina pectoris: Secondary | ICD-10-CM | POA: Insufficient documentation

## 2011-10-22 DIAGNOSIS — I059 Rheumatic mitral valve disease, unspecified: Secondary | ICD-10-CM | POA: Insufficient documentation

## 2011-10-22 DIAGNOSIS — I509 Heart failure, unspecified: Secondary | ICD-10-CM | POA: Insufficient documentation

## 2011-10-24 ENCOUNTER — Encounter (HOSPITAL_COMMUNITY)
Admission: RE | Admit: 2011-10-24 | Discharge: 2011-10-24 | Disposition: A | Discharge status: Home / Self Care | Payer: Medicare Other | Source: Ambulatory Visit | Attending: Pulmonary Disease | Admitting: Pulmonary Disease

## 2011-10-26 ENCOUNTER — Encounter (HOSPITAL_COMMUNITY): Payer: Medicare Other

## 2011-10-29 ENCOUNTER — Encounter (HOSPITAL_COMMUNITY): Payer: Medicare Other

## 2011-10-31 ENCOUNTER — Encounter (HOSPITAL_COMMUNITY)
Admission: RE | Admit: 2011-10-31 | Discharge: 2011-10-31 | Disposition: A | Discharge status: Home / Self Care | Payer: Medicare Other | Source: Ambulatory Visit | Attending: Pulmonary Disease | Admitting: Pulmonary Disease

## 2011-11-02 ENCOUNTER — Encounter (HOSPITAL_COMMUNITY): Payer: Medicare Other

## 2011-11-05 ENCOUNTER — Encounter (HOSPITAL_COMMUNITY)
Admission: RE | Admit: 2011-11-05 | Discharge: 2011-11-05 | Disposition: A | Discharge status: Home / Self Care | Payer: Medicare Other | Source: Ambulatory Visit | Attending: Pulmonary Disease | Admitting: Pulmonary Disease

## 2011-11-07 ENCOUNTER — Encounter (HOSPITAL_COMMUNITY)
Admission: RE | Admit: 2011-11-07 | Discharge: 2011-11-07 | Disposition: A | Discharge status: Home / Self Care | Payer: Medicare Other | Source: Ambulatory Visit | Attending: Pulmonary Disease | Admitting: Pulmonary Disease

## 2011-11-08 NOTE — Telephone Encounter (Signed)
Error

## 2011-11-09 ENCOUNTER — Encounter (HOSPITAL_COMMUNITY): Payer: Medicare Other

## 2011-11-12 ENCOUNTER — Encounter (HOSPITAL_COMMUNITY): Payer: Medicare Other

## 2011-11-14 ENCOUNTER — Encounter (HOSPITAL_COMMUNITY)
Admission: RE | Admit: 2011-11-14 | Discharge: 2011-11-14 | Disposition: A | Discharge status: Home / Self Care | Payer: Medicare Other | Source: Ambulatory Visit | Attending: Pulmonary Disease | Admitting: Pulmonary Disease

## 2011-11-19 ENCOUNTER — Encounter (HOSPITAL_COMMUNITY)
Admission: RE | Admit: 2011-11-19 | Discharge: 2011-11-19 | Disposition: A | Discharge status: Home / Self Care | Payer: Medicare Other | Source: Ambulatory Visit | Attending: Pulmonary Disease | Admitting: Pulmonary Disease

## 2011-11-19 DIAGNOSIS — I2510 Atherosclerotic heart disease of native coronary artery without angina pectoris: Secondary | ICD-10-CM | POA: Insufficient documentation

## 2011-11-19 DIAGNOSIS — I509 Heart failure, unspecified: Secondary | ICD-10-CM | POA: Insufficient documentation

## 2011-11-19 DIAGNOSIS — I059 Rheumatic mitral valve disease, unspecified: Secondary | ICD-10-CM | POA: Insufficient documentation

## 2011-11-21 ENCOUNTER — Encounter (HOSPITAL_COMMUNITY)
Admission: RE | Admit: 2011-11-21 | Discharge: 2011-11-21 | Disposition: A | Discharge status: Home / Self Care | Payer: Medicare Other | Source: Ambulatory Visit

## 2011-11-26 ENCOUNTER — Encounter (HOSPITAL_COMMUNITY)
Admission: RE | Admit: 2011-11-26 | Discharge: 2011-11-26 | Disposition: A | Discharge status: Home / Self Care | Payer: Medicare Other | Source: Ambulatory Visit | Attending: Pulmonary Disease | Admitting: Pulmonary Disease

## 2011-11-28 ENCOUNTER — Encounter (HOSPITAL_COMMUNITY)
Admission: RE | Admit: 2011-11-28 | Discharge: 2011-11-28 | Disposition: A | Discharge status: Home / Self Care | Payer: Medicare Other | Source: Ambulatory Visit | Attending: Pulmonary Disease | Admitting: Pulmonary Disease

## 2011-11-30 ENCOUNTER — Encounter (HOSPITAL_COMMUNITY): Payer: Medicare Other

## 2011-12-03 ENCOUNTER — Encounter (HOSPITAL_COMMUNITY)
Admission: RE | Admit: 2011-12-03 | Discharge: 2011-12-03 | Disposition: A | Discharge status: Home / Self Care | Payer: Medicare Other | Source: Ambulatory Visit | Attending: Pulmonary Disease | Admitting: Pulmonary Disease

## 2011-12-05 ENCOUNTER — Encounter (HOSPITAL_COMMUNITY)
Admission: RE | Admit: 2011-12-05 | Discharge: 2011-12-05 | Disposition: A | Discharge status: Home / Self Care | Payer: Medicare Other | Source: Ambulatory Visit | Attending: Pulmonary Disease | Admitting: Pulmonary Disease

## 2011-12-10 ENCOUNTER — Encounter (HOSPITAL_COMMUNITY)
Admission: RE | Admit: 2011-12-10 | Discharge: 2011-12-10 | Disposition: A | Discharge status: Home / Self Care | Payer: Medicare Other | Source: Ambulatory Visit | Attending: Pulmonary Disease | Admitting: Pulmonary Disease

## 2011-12-12 ENCOUNTER — Encounter (HOSPITAL_COMMUNITY): Payer: Medicare Other

## 2011-12-17 ENCOUNTER — Encounter (HOSPITAL_COMMUNITY): Payer: Medicare Other

## 2011-12-19 ENCOUNTER — Encounter (HOSPITAL_COMMUNITY): Payer: Medicare Other

## 2011-12-21 ENCOUNTER — Encounter (HOSPITAL_COMMUNITY): Payer: Medicare Other

## 2011-12-24 ENCOUNTER — Encounter (HOSPITAL_COMMUNITY): Payer: Medicare Other

## 2012-01-29 ENCOUNTER — Encounter: Payer: Self-pay | Admitting: Cardiology

## 2012-02-18 ENCOUNTER — Other Ambulatory Visit: Payer: Self-pay | Admitting: Cardiology

## 2012-02-21 ENCOUNTER — Ambulatory Visit (INDEPENDENT_AMBULATORY_CARE_PROVIDER_SITE_OTHER): Payer: Medicare Other | Admitting: Otolaryngology

## 2012-02-21 DIAGNOSIS — H6980 Other specified disorders of Eustachian tube, unspecified ear: Secondary | ICD-10-CM

## 2012-02-21 DIAGNOSIS — J310 Chronic rhinitis: Secondary | ICD-10-CM

## 2012-02-21 DIAGNOSIS — H908 Mixed conductive and sensorineural hearing loss, unspecified: Secondary | ICD-10-CM

## 2012-02-21 DIAGNOSIS — J343 Hypertrophy of nasal turbinates: Secondary | ICD-10-CM

## 2012-03-27 ENCOUNTER — Emergency Department (HOSPITAL_COMMUNITY): Payer: Medicare Other

## 2012-03-27 ENCOUNTER — Inpatient Hospital Stay (HOSPITAL_COMMUNITY)
Admission: EM | Admit: 2012-03-27 | Discharge: 2012-03-30 | DRG: 536 | Disposition: A | Discharge status: Skilled Nursing Facility | Payer: Medicare Other | Attending: Internal Medicine | Admitting: Internal Medicine

## 2012-03-27 ENCOUNTER — Encounter (HOSPITAL_COMMUNITY): Payer: Self-pay | Admitting: Emergency Medicine

## 2012-03-27 ENCOUNTER — Ambulatory Visit (INDEPENDENT_AMBULATORY_CARE_PROVIDER_SITE_OTHER): Payer: Medicare Other | Admitting: Otolaryngology

## 2012-03-27 DIAGNOSIS — I4891 Unspecified atrial fibrillation: Secondary | ICD-10-CM | POA: Diagnosis present

## 2012-03-27 DIAGNOSIS — Z7982 Long term (current) use of aspirin: Secondary | ICD-10-CM

## 2012-03-27 DIAGNOSIS — Z79899 Other long term (current) drug therapy: Secondary | ICD-10-CM

## 2012-03-27 DIAGNOSIS — W19XXXA Unspecified fall, initial encounter: Secondary | ICD-10-CM

## 2012-03-27 DIAGNOSIS — M810 Age-related osteoporosis without current pathological fracture: Secondary | ICD-10-CM | POA: Diagnosis present

## 2012-03-27 DIAGNOSIS — J8410 Pulmonary fibrosis, unspecified: Secondary | ICD-10-CM | POA: Diagnosis present

## 2012-03-27 DIAGNOSIS — Z66 Do not resuscitate: Secondary | ICD-10-CM | POA: Diagnosis present

## 2012-03-27 DIAGNOSIS — I2510 Atherosclerotic heart disease of native coronary artery without angina pectoris: Secondary | ICD-10-CM

## 2012-03-27 DIAGNOSIS — M1990 Unspecified osteoarthritis, unspecified site: Secondary | ICD-10-CM | POA: Diagnosis present

## 2012-03-27 DIAGNOSIS — IMO0002 Reserved for concepts with insufficient information to code with codable children: Secondary | ICD-10-CM

## 2012-03-27 DIAGNOSIS — Z8679 Personal history of other diseases of the circulatory system: Secondary | ICD-10-CM

## 2012-03-27 DIAGNOSIS — E860 Dehydration: Secondary | ICD-10-CM | POA: Diagnosis present

## 2012-03-27 DIAGNOSIS — S3282XA Multiple fractures of pelvis without disruption of pelvic ring, initial encounter for closed fracture: Principal | ICD-10-CM | POA: Diagnosis present

## 2012-03-27 DIAGNOSIS — J9610 Chronic respiratory failure, unspecified whether with hypoxia or hypercapnia: Secondary | ICD-10-CM

## 2012-03-27 DIAGNOSIS — E785 Hyperlipidemia, unspecified: Secondary | ICD-10-CM | POA: Diagnosis present

## 2012-03-27 DIAGNOSIS — S329XXA Fracture of unspecified parts of lumbosacral spine and pelvis, initial encounter for closed fracture: Secondary | ICD-10-CM | POA: Diagnosis present

## 2012-03-27 DIAGNOSIS — I69959 Hemiplegia and hemiparesis following unspecified cerebrovascular disease affecting unspecified side: Secondary | ICD-10-CM

## 2012-03-27 DIAGNOSIS — H409 Unspecified glaucoma: Secondary | ICD-10-CM | POA: Diagnosis present

## 2012-03-27 DIAGNOSIS — S32810A Multiple fractures of pelvis with stable disruption of pelvic ring, initial encounter for closed fracture: Secondary | ICD-10-CM

## 2012-03-27 DIAGNOSIS — I059 Rheumatic mitral valve disease, unspecified: Secondary | ICD-10-CM | POA: Diagnosis present

## 2012-03-27 DIAGNOSIS — Z7902 Long term (current) use of antithrombotics/antiplatelets: Secondary | ICD-10-CM

## 2012-03-27 LAB — CBC WITH DIFFERENTIAL/PLATELET
Basophils Absolute: 0 10*3/uL (ref 0.0–0.1)
Basophils Relative: 0 % (ref 0–1)
Hemoglobin: 12.4 g/dL (ref 12.0–15.0)
MCHC: 32.3 g/dL (ref 30.0–36.0)
Monocytes Relative: 7 % (ref 3–12)
Neutro Abs: 9.2 10*3/uL — ABNORMAL HIGH (ref 1.7–7.7)
Neutrophils Relative %: 72 % (ref 43–77)
RDW: 15.2 % (ref 11.5–15.5)
WBC: 12.7 10*3/uL — ABNORMAL HIGH (ref 4.0–10.5)

## 2012-03-27 LAB — COMPREHENSIVE METABOLIC PANEL
AST: 23 U/L (ref 0–37)
Albumin: 3.3 g/dL — ABNORMAL LOW (ref 3.5–5.2)
Alkaline Phosphatase: 47 U/L (ref 39–117)
Chloride: 97 mEq/L (ref 96–112)
Potassium: 3.5 mEq/L (ref 3.5–5.1)
Total Bilirubin: 0.5 mg/dL (ref 0.3–1.2)

## 2012-03-27 MED ORDER — BIMATOPROST 0.01 % OP SOLN
1.0000 [drp] | Freq: Every day | OPHTHALMIC | Status: DC
  Administered 2012-03-27 – 2012-03-29 (×3): 1 [drp] via OPHTHALMIC
  Filled 2012-03-27: qty 2.5

## 2012-03-27 MED ORDER — ALBUTEROL SULFATE (5 MG/ML) 0.5% IN NEBU: 2.5000 mg | INHALATION_SOLUTION | RESPIRATORY_TRACT | Status: DC | PRN

## 2012-03-27 MED ORDER — DIGOXIN 125 MCG PO TABS
0.1250 mg | ORAL_TABLET | Freq: Every day | ORAL | Status: DC
  Administered 2012-03-28 – 2012-03-30 (×3): 0.125 mg via ORAL
  Filled 2012-03-27 (×4): qty 1

## 2012-03-27 MED ORDER — BRIMONIDINE TARTRATE 0.2 % OP SOLN
1.0000 [drp] | Freq: Two times a day (BID) | OPHTHALMIC | Status: DC
  Administered 2012-03-27 – 2012-03-30 (×6): 1 [drp] via OPHTHALMIC
  Filled 2012-03-27: qty 5

## 2012-03-27 MED ORDER — ACETAMINOPHEN 325 MG PO TABS
650.0000 mg | ORAL_TABLET | Freq: Four times a day (QID) | ORAL | Status: DC | PRN
Start: 2012-03-27 — End: 2012-03-30

## 2012-03-27 MED ORDER — BRINZOLAMIDE 1 % OP SUSP
1.0000 [drp] | Freq: Two times a day (BID) | OPHTHALMIC | Status: DC
  Administered 2012-03-27 – 2012-03-30 (×6): 1 [drp] via OPHTHALMIC
  Filled 2012-03-27: qty 10

## 2012-03-27 MED ORDER — HYDROMORPHONE HCL PF 1 MG/ML IJ SOLN
0.5000 mg | INTRAMUSCULAR | Status: DC | PRN
  Administered 2012-03-27: 0.5 mg via INTRAVENOUS
  Filled 2012-03-27: qty 1

## 2012-03-27 MED ORDER — ONDANSETRON HCL 4 MG PO TABS: 4.0000 mg | ORAL_TABLET | Freq: Four times a day (QID) | ORAL | Status: DC | PRN

## 2012-03-27 MED ORDER — ONDANSETRON HCL 4 MG/2ML IJ SOLN: 4.0000 mg | Freq: Four times a day (QID) | INTRAMUSCULAR | Status: DC | PRN

## 2012-03-27 MED ORDER — TIMOLOL MALEATE 0.5 % OP SOLN
1.0000 [drp] | Freq: Two times a day (BID) | OPHTHALMIC | Status: DC
  Administered 2012-03-27 – 2012-03-30 (×6): 1 [drp] via OPHTHALMIC
  Filled 2012-03-27 (×2): qty 5

## 2012-03-27 MED ORDER — BIOTENE DRY MOUTH MT LIQD
15.0000 mL | Freq: Two times a day (BID) | OROMUCOSAL | Status: DC
  Administered 2012-03-28 – 2012-03-30 (×5): 15 mL via OROMUCOSAL

## 2012-03-27 MED ORDER — ACETAMINOPHEN 650 MG RE SUPP: 650.0000 mg | Freq: Four times a day (QID) | RECTAL | Status: DC | PRN

## 2012-03-27 MED ORDER — BRIMONIDINE TARTRATE-TIMOLOL 0.2-0.5 % OP SOLN: 1.0000 [drp] | Freq: Two times a day (BID) | OPHTHALMIC | Status: DC

## 2012-03-27 MED ORDER — HYDROMORPHONE HCL PF 1 MG/ML IJ SOLN
0.5000 mg | INTRAMUSCULAR | Status: DC | PRN
  Administered 2012-03-28: 0.5 mg via INTRAVENOUS
  Filled 2012-03-27 (×2): qty 1

## 2012-03-27 MED ORDER — ONDANSETRON HCL 4 MG/2ML IJ SOLN: 4.0000 mg | Freq: Three times a day (TID) | INTRAMUSCULAR | Status: DC | PRN

## 2012-03-27 MED ORDER — SODIUM CHLORIDE 0.9 % IV SOLN: INTRAVENOUS | Status: DC

## 2012-03-27 MED ORDER — SODIUM CHLORIDE 0.9 % IV SOLN
INTRAVENOUS | Status: DC
  Administered 2012-03-27 – 2012-03-30 (×6): via INTRAVENOUS

## 2012-03-27 MED ORDER — PREDNISONE 10 MG PO TABS
10.0000 mg | ORAL_TABLET | Freq: Every day | ORAL | Status: DC
  Administered 2012-03-27 – 2012-03-30 (×4): 10 mg via ORAL
  Filled 2012-03-27 (×4): qty 1
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
## 2012-03-27 NOTE — ED Notes (Signed)
Pt fell straight ontop her bottom. Complaining of right hip pain.
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
## 2012-03-28 LAB — CBC
Hemoglobin: 10.9 g/dL — ABNORMAL LOW (ref 12.0–15.0)
MCH: 30.2 pg (ref 26.0–34.0)
MCHC: 32 g/dL (ref 30.0–36.0)
MCV: 94.5 fL (ref 78.0–100.0)
Platelets: 209 10*3/uL (ref 150–400)
RBC: 3.61 MIL/uL — ABNORMAL LOW (ref 3.87–5.11)

## 2012-03-28 LAB — BASIC METABOLIC PANEL
BUN: 14 mg/dL (ref 6–23)
CO2: 31 mEq/L (ref 19–32)
Calcium: 8.9 mg/dL (ref 8.4–10.5)
GFR calc non Af Amer: 74 mL/min — ABNORMAL LOW (ref >90)
Glucose, Bld: 114 mg/dL — ABNORMAL HIGH (ref 70–99)

## 2012-03-28 MED ORDER — ENSURE COMPLETE PO LIQD
237.0000 mL | Freq: Two times a day (BID) | ORAL | Status: DC
  Administered 2012-03-28 – 2012-03-29 (×2): 237 mL via ORAL

## 2012-03-28 NOTE — Clinical Social Work Placement (Signed)
Clinical Social Work Department CLINICAL SOCIAL WORK PLACEMENT NOTE 03/28/2012  Patient:  Tabitha [name]  Account Number:  [id number] Admit date:  03/26/2012  Clinical Social Worker:  Derenda Fennel, LCSW  Date/time:  03/27/2012 03:50 PM  Clinical Social Work is seeking post-discharge placement for this patient at the following level of care:   SKILLED NURSING   (*CSW will update this form in Epic as items are completed)   03/27/2012  Patient/family provided with [facility] Department of Clinical Social Work's list of facilities offering this level of care within the geographic area requested by the patient (or if unable, by the patient's family).  03/27/2012  Patient/family informed of their freedom to choose among providers that offer the needed level of care, that participate in Medicare, Medicaid or managed care program needed by the patient, have an available bed and are willing to accept the patient.  03/27/2012  Patient/family informed of [facility]' ownership interest in [facility], as well as of the fact that they are under no obligation to receive care at this facility.  PASARR submitted to EDS on 03/27/2012 PASARR number received from EDS on 03/27/2012  FL2 transmitted to all facilities in geographic area requested by pt/family on  03/27/2012 FL2 transmitted to all facilities within larger geographic area on   Patient informed that his/her managed care company has contracts with or will negotiate with  certain facilities, including the following:     Patient/family informed of bed offers received:  03/28/2012 Patient chooses bed at [facility] [place] Physician recommends and patient chooses bed at  [facility] [place]  Patient to be transferred to  on   Patient to be transferred to facility by   The following physician request were entered in Epic:   Additional Comments:  Derenda Fennel, LCSW [phone]

## 2012-03-28 NOTE — Progress Notes (Signed)
UR Chart Review Completed  

## 2012-03-28 NOTE — Progress Notes (Deleted)
Current Facility-Administered Medications  Medication Dose Route Frequency Provider Last Rate Last Dose  . 0.9 %  sodium chloride infusion   Intravenous Continuous Erick Blinks, MD 75 mL/hr at 03/28/12 0200    . acetaminophen (TYLENOL) tablet 650 mg  650 mg Oral Q6H PRN Erick Blinks, MD       Or  . acetaminophen (TYLENOL) suppository 650 mg  650 mg Rectal Q6H PRN Erick Blinks, MD      . albuterol (PROVENTIL) (5 MG/ML) 0.5% nebulizer solution 2.5 mg  2.5 mg Nebulization Q2H PRN Erick Blinks, MD      . antiseptic oral rinse (BIOTENE) solution 15 mL  15 mL Mouth Rinse BID Erick Blinks, MD      . bimatoprost (LUMIGAN) 0.01 % ophthalmic solution 1 drop  1 drop Both Eyes QHS Erick Blinks, MD   1 drop at 03/27/12 2157  . timolol (TIMOPTIC) 0.5 % ophthalmic solution 1 drop  1 drop Both Eyes BID Erick Blinks, MD   1 drop at 03/27/12 2155   And  . brimonidine (ALPHAGAN) 0.2 % ophthalmic solution 1 drop  1 drop Both Eyes BID Erick Blinks, MD   1 drop at 03/27/12 2155  . brinzolamide (AZOPT) 1 % ophthalmic suspension 1 drop  1 drop Both Eyes BID Erick Blinks, MD   1 drop at 03/27/12 2157  . digoxin (LANOXIN) tablet 0.125 mg  0.125 mg Oral Daily Erick Blinks, MD      . HYDROmorphone (DILAUDID) injection 0.5 mg  0.5 mg Intravenous Q4H PRN Erick Blinks, MD      . ondansetron (ZOFRAN) tablet 4 mg  4 mg Oral Q6H PRN Erick Blinks, MD       Or  . ondansetron (ZOFRAN) injection 4 mg  4 mg Intravenous Q6H PRN Erick Blinks, MD      . predniSONE (DELTASONE) tablet 10 mg  10 mg Oral Daily Erick Blinks, MD   10 mg at 03/27/12 1855  . [DISCONTINUED] 0.9 %  sodium chloride infusion   Intravenous STAT Glynn Octave, MD      . [DISCONTINUED] brimonidine-timolol (COMBIGAN) 0.2-0.5 % ophthalmic solution 1 drop  1 drop Both Eyes Q12H Erick Blinks, MD      . [DISCONTINUED] HYDROmorphone (DILAUDID) injection 0.5 mg  0.5 mg Intravenous Q4H PRN Glynn Octave, MD   0.5 mg at 03/27/12 1631  .  [DISCONTINUED] ondansetron (ZOFRAN) injection 4 mg  4 mg Intravenous Q8H PRN Glynn Octave, MD

## 2012-03-28 NOTE — Evaluation (Signed)
Physical Therapy Evaluation Patient Details Name: Tabitha [name] MRN: [id number] DOB: 05/05/1918 Today's Date: 03/28/2012 Time: 4098-1191 PT Time Calculation (min): 48 min  PT Assessment / Plan / Recommendation Clinical Impression  Pt was seen for initial eval/tx.  She is very alert, oriented and cooperative.  She has no pain at rest.  She was able to tolerate gentle ther ex in the bed for LE ROM and strengthening,  It took total assist to get her to EOB and she was unable to tolerate sitiing due to severe pelvic pain.  She has to be returned to supine position.  We will give her the rest of the day to rest and then try again tomorrow.  She will need SNF at d/c and is agreeable to it.    PT Assessment  Patient needs continued PT services    Follow Up Recommendations  SNF    Does the patient have the potential to tolerate intense rehabilitation      Barriers to Discharge Decreased caregiver support      Equipment Recommendations  None recommended by PT    Recommendations for Other Services     Frequency Min 6X/week    Precautions / Restrictions Precautions Precautions: Fall Restrictions Weight Bearing Restrictions: Yes RLE Weight Bearing: Weight bearing as tolerated   Pertinent Vitals/Pain       Mobility  Bed Mobility Bed Mobility: Supine to Sit;Sit to Supine Supine to Sit: 1: +1 Total assist;HOB elevated Sit to Supine: 1: +1 Total assist;HOB elevated Details for Bed Mobility Assistance: pt got to EOB in seated position but could not tolerate the sitting position and could not tolerate having her R hip/knee flexed Transfers Transfers: Not assessed    Shoulder Instructions     Exercises General Exercises - Lower Extremity Ankle Circles/Pumps: AROM;Both;10 reps;Supine Quad Sets: AROM;Both;10 reps;Supine Gluteal Sets: AROM;Both;10 reps;Supine Short Arc Quad: AROM;Both;10 reps;Supine Heel Slides: AAROM;Both;10 reps;Supine Hip ABduction/ADduction: AAROM;Both;10  reps;Supine   PT Diagnosis: Difficulty walking;Acute pain  PT Problem List: Decreased strength;Decreased range of motion;Decreased activity tolerance;Decreased mobility;Decreased knowledge of use of DME;Decreased knowledge of precautions;Pain PT Treatment Interventions: Functional mobility training;Therapeutic activities;Therapeutic exercise;Gait training;Patient/family education   PT Goals Acute Rehab PT Goals PT Goal Formulation: With patient Time For Goal Achievement: 04/11/12 Potential to Achieve Goals: Good Pt will go Supine/Side to Sit: with max assist;with HOB not 0 degrees (comment degree) PT Goal: Supine/Side to Sit - Progress: Goal set today Pt will go Sit to Supine/Side: with max assist;with HOB not 0 degrees (comment degree) PT Goal: Sit to Supine/Side - Progress: Goal set today Pt will go Sit to Stand: with max assist;with upper extremity assist PT Goal: Sit to Stand - Progress: Goal set today Pt will go Stand to Sit: with max assist;with upper extremity assist PT Goal: Stand to Sit - Progress: Goal set today Pt will Transfer Bed to Chair/Chair to Bed: with max assist PT Transfer Goal: Bed to Chair/Chair to Bed - Progress: Goal set today Pt will Ambulate: 1 - 15 feet;with max assist;with rolling walker PT Goal: Ambulate - Progress: Goal set today  Visit Information  Last PT Received On: 03/28/12    Subjective Data  Subjective: it hurts when I roll  over Patient Stated Goal: return home   Prior Functioning  Home Living Lives With: Alone Available Help at Discharge: Friend(s);Available PRN/intermittently Type of Home: House Home Access: Stairs to enter [employer] of Steps: 5 Entrance Stairs-Rails: Right Home Layout: Two level Alternate Level Stairs-Number of Steps: has  a stair elevator Bathroom Shower/Tub: Health visitor: Standard Home Adaptive Equipment: Walker - rolling;Wheelchair - manual;Straight cane;Bedside commode/3-in-1 Prior  Function Level of Independence: Independent with assistive device(s) Able to Take Stairs?: Yes Driving: Yes Vocation: Retired Musician: No difficulties    Cognition  Overall Cognitive Status: Appears within functional limits for tasks assessed/performed Arousal/Alertness: Awake/alert Orientation Level: Appears intact for tasks assessed Behavior During Session: [facility] for tasks performed    Extremity/Trunk Assessment Right Upper Extremity Assessment RUE ROM/Strength/Tone: Within functional levels Left Upper Extremity Assessment LUE ROM/Strength/Tone: Within functional levels Right Lower Extremity Assessment RLE ROM/Strength/Tone: Unable to fully assess;Due to pain RLE Sensation: WFL - Light Touch Left Lower Extremity Assessment LLE ROM/Strength/Tone: WFL for tasks assessed LLE Sensation: WFL - Light Touch LLE Coordination: WFL - gross motor Trunk Assessment Trunk Assessment: Kyphotic   Balance Balance Balance Assessed: No  End of Session PT - End of Session Equipment Utilized During Treatment: Gait belt Activity Tolerance: Patient limited by pain Patient left: in bed;with call bell/phone within reach;with bed alarm set Nurse Communication: Mobility status  GP     Konrad Penta 03/28/2012, 11:49 AM

## 2012-03-28 NOTE — Plan of Care (Signed)
Problem: Phase II Progression Outcomes Goal: Discharge plan established Outcome: Completed/Met Date Met:  03/28/12 Plans for SNF placement at discharge

## 2012-03-28 NOTE — Clinical Social Work Note (Signed)
CSW presented bed offers to pt and she chooses [facility]. Per MD, pt possibly ready Sunday or Monday. Pt is aware if she is stable on Sunday she will have to go to [facility] as [facility] unable to accept pts on Sunday.  MD also notified. Call Debbie at [phone] if pt d/c to [facility].  Derenda Fennel, [place] [phone]

## 2012-03-28 NOTE — Progress Notes (Signed)
[facility]             Progress Note   Subjective: Patient says pain is controlled if she is not moving.  She has no new other complaints.  Objective: Vital signs in last 24 hours: Temp:  [97.5 F (36.4 C)-97.8 F (36.6 C)] 97.8 F (36.6 C) (11/08 0553) Pulse Rate:  [90-106] 90  (11/08 1028) Resp:  [16-20] 18  (11/08 0553) BP: (111-154)/(65-103) 135/84 mmHg (11/08 0553) SpO2:  [89 %-100 %] 100 % (11/08 0553) Weight:  [45.36 kg (100 lb)-48.9 kg (107 lb 12.9 oz)] 48.9 kg (107 lb 12.9 oz) (11/07 1637) Weight change:  Last BM Date: 03/28/12  Intake/Output from previous day: 11/07 0701 - 11/08 0700 In: 820 [I.V.:820] Out: -      Physical Exam: General: Alert, awake, oriented x3, in no acute distress. HEENT: No bruits, no goiter. Heart: Regular rate and rhythm, without murmurs, rubs, gallops. Lungs: Clear to auscultation bilaterally. Abdomen: Soft, nontender, nondistended, positive bowel sounds. Extremities: No clubbing cyanosis or edema with positive pedal pulses. Neuro: Grossly intact, nonfocal.    Lab Results: Basic Metabolic Panel:  Basename 03/28/12 0536 03/27/12 1236  NA 137 139  K 4.3 3.5  CL 98 97  CO2 31 33*  GLUCOSE 114* 100*  BUN 14 14  CREATININE 0.68 0.76  CALCIUM 8.9 9.3  MG -- --  PHOS -- --   Liver Function Tests:  Basename 03/27/12 1236  AST 23  ALT 19  ALKPHOS 47  BILITOT 0.5  PROT 6.5  ALBUMIN 3.3*   No results found for this basename: LIPASE:2,AMYLASE:2 in the last 72 hours No results found for this basename: AMMONIA:2 in the last 72 hours CBC:  Basename 03/28/12 0536 03/27/12 1236  WBC 9.0 12.7*  NEUTROABS -- 9.2*  HGB 10.9* 12.4  HCT 34.1* 38.4  MCV 94.5 95.0  PLT 209 253   Cardiac Enzymes:  Basename 03/27/12 1236  CKTOTAL --  CKMB --  CKMBINDEX --  TROPONINI <0.30   BNP: No results found for this basename: PROBNP:3 in the last 72 hours D-Dimer: No results found for this basename: DDIMER:2 in the  last 72 hours CBG: No results found for this basename: GLUCAP:6 in the last 72 hours Hemoglobin A1C: No results found for this basename: HGBA1C in the last 72 hours Fasting Lipid Panel: No results found for this basename: CHOL,HDL,LDLCALC,TRIG,CHOLHDL,LDLDIRECT in the last 72 hours Thyroid Function Tests: No results found for this basename: TSH,T4TOTAL,FREET4,T3FREE,THYROIDAB in the last 72 hours Anemia Panel: No results found for this basename: VITAMINB12,FOLATE,FERRITIN,TIBC,IRON,RETICCTPCT in the last 72 hours Coagulation: No results found for this basename: LABPROT:2,INR:2 in the last 72 hours Urine Drug Screen: Drugs of Abuse  No results found for this basename: labopia, cocainscrnur, labbenz, amphetmu, thcu, labbarb    Alcohol Level: No results found for this basename: ETH:2 in the last 72 hours Urinalysis: No results found for this basename: COLORURINE:2,APPERANCEUR:2,LABSPEC:2,PHURINE:2,GLUCOSEU:2,HGBUR:2,BILIRUBINUR:2,KETONESUR:2,PROTEINUR:2,UROBILINOGEN:2,NITRITE:2,LEUKOCYTESUR:2 in the last 72 hours  No results found for this or any previous visit (from the past 240 hour(s)).  Studies/Results: Dg Chest 1 View  03/27/2012  *RADIOLOGY REPORT*  Clinical Data: Post fall  CHEST - 1 VIEW  Comparison: 03/31/2011; 11/11/2008; chest CT - 12/13/2008  Findings: Grossly unchanged enlarged cardiac silhouette and mediastinal contours post median sternotomy and valve replacement. The lungs remain hyperexpanded.  Grossly unchanged extensive bilateral coarse reticular opacities.  Unchanged biapical pleural parenchymal thickening.  There is chronic blunting left costophrenic angle without definite pleural effusion.  No new discrete  focal airspace opacities.  No definite pneumothorax. Unchanged bones.  IMPRESSION: Grossly stable findings of advanced emphysematous change and fibrosis without definite superimposed acute cardiopulmonary disease.   Original Report Authenticated By: Tacey Ruiz, MD     Dg Hip Complete Right  03/27/2012  *RADIOLOGY REPORT*  Clinical Data: Fall  RIGHT HIP - COMPLETE 2+ VIEW  Comparison: None.  Findings: There is a minimally displaced fracture of the left pubic body as well as the left superior and inferior pubic rami.  There are irregular sclerotic and linear opacities projecting over the right iliac bone.  The right iliac also has an abnormal appearance and a complex fracture involving the right iliac bone may be present.  Sacrum is grossly intact.  Right femoral neck is grossly intact. The right acetabulum is grossly intact.  Degenerative changes in the lumbar spine with L4-5 levoscoliosis.  IMPRESSION: Fracture of the left pubic rami.  Complex fracture involving the right iliac bone is suspected.  CT pelvis is recommended.   Original Report Authenticated By: Jolaine Click, M.D.    Ct Head Wo Contrast  03/27/2012  *RADIOLOGY REPORT*  Clinical Data: Fall, history of stroke, on Plavix  CT HEAD WITHOUT CONTRAST  Technique:  Contiguous axial images were obtained from the base of the skull through the vertex without contrast.  Comparison: 03/31/2011  Findings: Generalized atrophy. Normal ventricular morphology. No midline shift or mass effect. Small vessel chronic ischemic changes of deep cerebral white matter. No intracranial hemorrhage, mass lesion, or acute infarction. Increased opacification of the left mastoid air cells with increased opacification at the left middle ear cavity as well. Visualized paranasal sinuses and right mastoid air cells clear. Bones demineralized with degenerative changes at the anterior arch of C1. Atherosclerotic calcifications at skull base.  IMPRESSION: Atrophy with small vessel chronic ischemic changes of deep cerebral white matter. No acute intracranial abnormalities. Left mastoid and middle ear cavity opacification, increased.   Original Report Authenticated By: Ulyses Southward, M.D.    Ct Pelvis Wo Contrast  03/27/2012  *RADIOLOGY REPORT*   Clinical Data: Fall, hip pain  CT PELVIS WITHOUT CONTRAST  Technique:  Multidetector CT imaging of the pelvis was performed following the standard protocol without intravenous contrast.  Comparison: Plain films same day  Findings: Diffuse osteopenia is noted.  Degenerative changes are noted lower lumbar spine.  No sacral fracture is identified.  There is a comminuted displaced fracture of  the right iliac bone. This is best seen in axial image 73. There is small adjacent hematoma just lateral to the iliac bone.  No significant intrapelvic hematoma at this level.  Again noted mild displaced fracture of the left superior pubic ramus adjacent and progressing in pubic symphysis.  Best seen in axial image 28.  There is mild displaced comminuted fracture of the left inferior pubic ramus at the level of pubic symphysis.  The urinary bladder is unremarkable.  There is small anterior pelvic hematoma axial image 86 measures 3.2 x 4.4 cm. Atrophic uterus is noted.  No hip fracture or subluxation.  No acetabular fracture.  IMPRESSION:  1.  There is a comminuted displaced fracture of the right iliac bone.  Small amount of hematoma is noted just lateral to the iliac bone.  No intrapelvic hematoma at this level. 2.  Diffuse osteopenia is noted.  Mild displaced fracture at pubic symphysis of the left superior pubic ramus.  There is displaced comminuted fracture of the left inferior pubic ramus at the level of the pubic symphysis.  There is small  anterior pelvic hematoma at the level of the pubic symphysis measures 3.20 x 4.4 cm. 3.  No acetabular fracture.  No hip fracture.  No sacral fracture.   Original Report Authenticated By: Natasha Mead, M.D.     Medications: Scheduled Meds:   . antiseptic oral rinse  15 mL Mouth Rinse BID  . bimatoprost  1 drop Both Eyes QHS  . timolol  1 drop Both Eyes BID   And  . brimonidine  1 drop Both Eyes BID  . brinzolamide  1 drop Both Eyes BID  . digoxin  0.125 mg Oral Daily  .  predniSONE  10 mg Oral Daily  . [DISCONTINUED] sodium chloride   Intravenous STAT  . [DISCONTINUED] brimonidine-timolol  1 drop Both Eyes Q12H   Continuous Infusions:   . sodium chloride 100 mL/hr at 03/28/12 1041   PRN Meds:.acetaminophen, acetaminophen, albuterol, HYDROmorphone (DILAUDID) injection, ondansetron (ZOFRAN) IV, ondansetron, [DISCONTINUED]  HYDROmorphone (DILAUDID) injection, [DISCONTINUED] ondansetron (ZOFRAN) IV  Assessment/Plan:  Principal Problem:  *Pelvic fracture Active Problems:  History of atrial fibrillation  Pulmonary fibrosis  Chronic respiratory failure  1. Pelvic fracture. Appreciate Dr. Sanjuan Dame input.  Management will be non operative.  Physical therapy to see.  Will likely need nursing home placement.  2. Hematoma lateral to iliac bone. S/p fall.  Patient was on aspirin and plavix prior to admission which have both been held. Will need follow up CBC in am  3. Chronic resp failure, on oxygen and prednisone, stable  4. Dispo. Will likely need SNF placement.  Await PT eval  Time spent coordinating care:   LOS: 1 day   Aprille Sawhney [facility] Pager: [phone] 03/28/2012, 11:10 AM
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
## 2012-03-28 NOTE — Clinical Social Work Psychosocial (Signed)
Clinical Social Work Department BRIEF PSYCHOSOCIAL ASSESSMENT 03/28/2012  Patient:  Tabitha [name], Tabitha [name]     Account Number:  [id number]     Admit date:  03/27/2012  Clinical Social Worker:  Nancie Neas  Date/Time:  03/28/2012 01:50 PM  Referred by:  Physician  Date Referred:  03/28/2012 Referred for  SNF Placement   Other Referral:   Interview type:  Patient Other interview type:    PSYCHOSOCIAL DATA Living Status:  ALONE Admitted from facility:   Level of care:   Primary support name:  Leonette Most Primary support relationship to patient:  CHILD, ADULT Degree of support available:   children all live out of town    CURRENT CONCERNS Current Concerns  Post-Acute Placement   Other Concerns:    SOCIAL WORK ASSESSMENT / PLAN CSW met with pt at bedside following referral from MD/PT for SNF. Pt alert and oriented and reports she lives alone and generally does very well. Her children all live out of state although she talks to them often. Pt states she was in the kitchen yesterday and turned too quickly to hang up the phone and she fell. Pt does have life alert but it was not on her. She was able to call EMS since she was unable to get up. Pt has pelvic fracture and was limited with PT due to pain. SNF recommended. CSW discussed placement with pt and she is aware of Medicare criteria/coverage. Pt is inpatient status at this time per CM. SNF list provided. Pt would like to stay in [place] if possible.   Assessment/plan status:  Psychosocial Support/Ongoing Assessment of Needs Other assessment/ plan:   Information/referral to community resources:   SNF list    PATIENT'S/FAMILY'S RESPONSE TO PLAN OF CARE: Pt is very pleasant and is understanding of need for SNF. She is very agreeable to plan. CSW will follow up with bed offers when available.        Derenda Fennel, [place] [phone]

## 2012-03-29 DIAGNOSIS — Z8679 Personal history of other diseases of the circulatory system: Secondary | ICD-10-CM

## 2012-03-29 LAB — CBC
HCT: 33.6 % — ABNORMAL LOW (ref 36.0–46.0)
MCHC: 33 g/dL (ref 30.0–36.0)
MCV: 94.1 fL (ref 78.0–100.0)
Platelets: 198 10*3/uL (ref 150–400)
RDW: 15.1 % (ref 11.5–15.5)
WBC: 10.9 10*3/uL — ABNORMAL HIGH (ref 4.0–10.5)

## 2012-03-29 MED ORDER — HYDROCODONE-ACETAMINOPHEN 5-500 MG PO TABS: 1.0000 | ORAL_TABLET | Freq: Three times a day (TID) | ORAL | Status: DC | PRN

## 2012-03-29 NOTE — Progress Notes (Signed)
Physical Therapy Treatment Patient Details Name: Tabitha [name] MRN: [id number] DOB: 1918-05-20 Today's Date: 03/29/2012 Time: 1520-1600 PT Time Calculation (min): 40 min  PT Assessment / Plan / Recommendation Comments on Treatment Session  Patient is pleasant and cooperative and aware of current condition. She participated during this therapy session and was given rest breaks during exercises seconodary to pain and low functional activity tolerance. Patient has good potential for improvement and will benefit with continued skilled PT services to work on functional mobility in a SNF setting at this time. Patient per nursing tolerated sitting on chair for 1-2hours today.     Follow Up Recommendations  SNF     Does the patient have the potential to tolerate intense rehabilitation     Barriers to Discharge        Equipment Recommendations  None recommended by PT    Recommendations for Other Services    Frequency Min 6X/week   Plan Discharge plan remains appropriate    Precautions / Restrictions Precautions Precautions: Fall Restrictions Weight Bearing Restrictions: No RLE Weight Bearing: Weight bearing as tolerated       Mobility  Bed Mobility Bed Mobility: Rolling Right;Rolling Left Rolling Right: 2: Max assist Rolling Left: 2: Max assist Details for Bed Mobility Assistance: Patient requested not to sit on EOB at the time of therapy due to pain.   Transfers Transfers: Not assessed    Exercises Total Joint Exercises Ankle Circles/Pumps: AROM;Both;20 reps;Supine Quad Sets: AROM;Both;20 reps;Supine Short Arc Quad: AAROM;Both;20 reps;Supine Hip ABduction/ADduction: AAROM;Both;20 reps;Supine Knee Flexion: AAROM;10 reps;Both;Supine   PT Diagnosis:    PT Problem List:   PT Treatment Interventions:     PT Goals Acute Rehab PT Goals PT Goal Formulation: With patient Potential to Achieve Goals: Good Pt will go Supine/Side to Sit: with max assist Pt will go Sit to  Supine/Side: with max assist Pt will go Sit to Stand: with max assist Pt will go Stand to Sit: with max assist Pt will Ambulate: 1 - 15 feet;with max assist  Visit Information  Last PT Received On: 03/29/12    Subjective Data  Subjective: Pt c/o during rolling from side to side  Patient Stated Goal: get better and return home    Cognition  Overall Cognitive Status: Appears within functional limits for tasks assessed/performed Arousal/Alertness: Awake/alert Orientation Level: Appears intact for tasks assessed Behavior During Session: [facility] for tasks performed    Balance  Balance Balance Assessed: No  End of Session PT - End of Session Equipment Utilized During Treatment: Oxygen Activity Tolerance: Patient limited by pain;Patient limited by fatigue Patient left: in bed;with call bell/phone within reach Nurse Communication: Mobility status   GP     Sergey Ishler, Larna Daughters 03/29/2012, 4:11 PM

## 2012-03-29 NOTE — Progress Notes (Signed)
Gen. Surgery  Social consult.  Care as per Ortho service.   Nurses told to be sure and remind pt to take her stool softener for obvious reasons.  Thanks for letting me know she was hospitalized.

## 2012-03-29 NOTE — Progress Notes (Deleted)
Physician Discharge Summary  Tabitha [name]:[id number] DOB: 10-09-1917 DOA: 03/27/2012  PCP: Marlane Hatcher, MD  Admit date: 03/27/2012 Discharge date: 03/29/2012  Time spent: 35 minutes  Recommendations for Outpatient Follow-up:  1. Patient will be sent to a skilled nursing facility for physical therapy.  Discharge Diagnoses:  Principal Problem:  *Pelvic fracture Active Problems:  History of atrial fibrillation  Pulmonary fibrosis  Chronic respiratory failure Hematoma lateral to iliac bone. Fall  Discharge Condition: improved  Diet recommendation: low salt  Filed Weights   03/27/12 1122 03/27/12 1637  Weight: 45.36 kg (100 lb) 48.9 kg (107 lb 12.9 oz)    History of present illness:  Tabitha [name] is a 78 y.o. female who was at home today and had a fall. She denies any LOC, head trauma. She said she did feel a little dizzy prior to falling, but feels that she turned too quickly, lost her balance and fell. She denies any chest pain or worsening trouble breathing. She does have pulmonary fibrosis and is chronically on oxygen and prednisone. She was evaluated in the ED and found to have a pelvic fracture.   Hospital Course:  This lady was admitted to the hospital after having a fall and suffering from a pelvic fracture.  She was seen by Dr. Hilda Lias who did not recommend any surgical management.  She was seen by physical therapy who recommended SNF placement for patient.  Her main complaint is pain at this time, which appears to be reasonably controlled.   She was found to have a hematoma lateral to iliac bone.  CBCs did not show a significant decline in hemoglobin.  She was mildly dehydrated on admission, this corrected with IV fluids.  She is felt stable to discharge to SNF today.  Procedures:  none  Consultations:  Orthopedics. Dr. Hilda Lias  Discharge Exam: Filed Vitals:   03/28/12 2147 03/28/12 2349 03/29/12 0609 03/29/12 0743  BP: 120/81  136/82   Pulse:  103  54   Temp: 97.8 F (36.6 C)  97.9 F (36.6 C)   TempSrc: Oral  Oral   Resp: 18  18   Height:      Weight:      SpO2: 95% 95% 99% 98%    General: NAD Cardiovascular: s1, s2, rrr Respiratory: cta b  Discharge Instructions  Discharge Orders    Future Orders Please Complete By Expires   Diet - low sodium heart healthy      Increase activity slowly          Medication List     As of 03/29/2012 12:47 PM    TAKE these medications         bimatoprost 0.01 % Soln   Commonly known as: LUMIGAN   Place 1 drop into both eyes at bedtime.      brinzolamide 1 % ophthalmic suspension   Commonly known as: AZOPT   Place 1 drop into both eyes 2 (two) times daily. Receives sample from doctor      COMBIGAN 0.2-0.5 % ophthalmic solution   Generic drug: brimonidine-timolol   Place 1 drop into both eyes every 12 (twelve) hours.      digoxin 0.125 MG tablet   Commonly known as: LANOXIN   Take 0.125 mg by mouth daily.      HYDROcodone-acetaminophen 5-500 MG per tablet   Commonly known as: VICODIN   Take 1 tablet by mouth every 8 (eight) hours as needed for pain.      predniSONE  5 MG tablet   Commonly known as: DELTASONE   Take 10 mg by mouth daily.          The results of significant diagnostics from this hospitalization (including imaging, microbiology, ancillary and laboratory) are listed below for reference.    Significant Diagnostic Studies: Dg Chest 1 View  03/27/2012  *RADIOLOGY REPORT*  Clinical Data: Post fall  CHEST - 1 VIEW  Comparison: 03/31/2011; 11/11/2008; chest CT - 12/13/2008  Findings: Grossly unchanged enlarged cardiac silhouette and mediastinal contours post median sternotomy and valve replacement. The lungs remain hyperexpanded.  Grossly unchanged extensive bilateral coarse reticular opacities.  Unchanged biapical pleural parenchymal thickening.  There is chronic blunting left costophrenic angle without definite pleural effusion.  No new discrete focal  airspace opacities.  No definite pneumothorax. Unchanged bones.  IMPRESSION: Grossly stable findings of advanced emphysematous change and fibrosis without definite superimposed acute cardiopulmonary disease.   Original Report Authenticated By: Tacey Ruiz, MD    Dg Hip Complete Right  03/27/2012  *RADIOLOGY REPORT*  Clinical Data: Fall  RIGHT HIP - COMPLETE 2+ VIEW  Comparison: None.  Findings: There is a minimally displaced fracture of the left pubic body as well as the left superior and inferior pubic rami.  There are irregular sclerotic and linear opacities projecting over the right iliac bone.  The right iliac also has an abnormal appearance and a complex fracture involving the right iliac bone may be present.  Sacrum is grossly intact.  Right femoral neck is grossly intact. The right acetabulum is grossly intact.  Degenerative changes in the lumbar spine with L4-5 levoscoliosis.  IMPRESSION: Fracture of the left pubic rami.  Complex fracture involving the right iliac bone is suspected.  CT pelvis is recommended.   Original Report Authenticated By: Jolaine Click, M.D.    Ct Head Wo Contrast  03/27/2012  *RADIOLOGY REPORT*  Clinical Data: Fall, history of stroke, on Plavix  CT HEAD WITHOUT CONTRAST  Technique:  Contiguous axial images were obtained from the base of the skull through the vertex without contrast.  Comparison: 03/31/2011  Findings: Generalized atrophy. Normal ventricular morphology. No midline shift or mass effect. Small vessel chronic ischemic changes of deep cerebral white matter. No intracranial hemorrhage, mass lesion, or acute infarction. Increased opacification of the left mastoid air cells with increased opacification at the left middle ear cavity as well. Visualized paranasal sinuses and right mastoid air cells clear. Bones demineralized with degenerative changes at the anterior arch of C1. Atherosclerotic calcifications at skull base.  IMPRESSION: Atrophy with small vessel chronic  ischemic changes of deep cerebral white matter. No acute intracranial abnormalities. Left mastoid and middle ear cavity opacification, increased.   Original Report Authenticated By: Ulyses Southward, M.D.    Ct Pelvis Wo Contrast  03/27/2012  *RADIOLOGY REPORT*  Clinical Data: Fall, hip pain  CT PELVIS WITHOUT CONTRAST  Technique:  Multidetector CT imaging of the pelvis was performed following the standard protocol without intravenous contrast.  Comparison: Plain films same day  Findings: Diffuse osteopenia is noted.  Degenerative changes are noted lower lumbar spine.  No sacral fracture is identified.  There is a comminuted displaced fracture of  the right iliac bone. This is best seen in axial image 73. There is small adjacent hematoma just lateral to the iliac bone.  No significant intrapelvic hematoma at this level.  Again noted mild displaced fracture of the left superior pubic ramus adjacent and progressing in pubic symphysis.  Best seen in axial image 28.  There is mild displaced comminuted fracture of the left inferior pubic ramus at the level of pubic symphysis.  The urinary bladder is unremarkable.  There is small anterior pelvic hematoma axial image 86 measures 3.2 x 4.4 cm. Atrophic uterus is noted.  No hip fracture or subluxation.  No acetabular fracture.  IMPRESSION:  1.  There is a comminuted displaced fracture of the right iliac bone.  Small amount of hematoma is noted just lateral to the iliac bone.  No intrapelvic hematoma at this level. 2.  Diffuse osteopenia is noted.  Mild displaced fracture at pubic symphysis of the left superior pubic ramus.  There is displaced comminuted fracture of the left inferior pubic ramus at the level of the pubic symphysis.  There is small  anterior pelvic hematoma at the level of the pubic symphysis measures 3.20 x 4.4 cm. 3.  No acetabular fracture.  No hip fracture.  No sacral fracture.   Original Report Authenticated By: Natasha Mead, M.D.     Microbiology: No  results found for this or any previous visit (from the past 240 hour(s)).   Labs: Basic Metabolic Panel:  Lab 03/28/12 4098 03/27/12 1236  NA 137 139  K 4.3 3.5  CL 98 97  CO2 31 33*  GLUCOSE 114* 100*  BUN 14 14  CREATININE 0.68 0.76  CALCIUM 8.9 9.3  MG -- --  PHOS -- --   Liver Function Tests:  Lab 03/27/12 1236  AST 23  ALT 19  ALKPHOS 47  BILITOT 0.5  PROT 6.5  ALBUMIN 3.3*   No results found for this basename: LIPASE:5,AMYLASE:5 in the last 168 hours No results found for this basename: AMMONIA:5 in the last 168 hours CBC:  Lab 03/29/12 0645 03/28/12 0536 03/27/12 1236  WBC 10.9* 9.0 12.7*  NEUTROABS -- -- 9.2*  HGB 11.1* 10.9* 12.4  HCT 33.6* 34.1* 38.4  MCV 94.1 94.5 95.0  PLT 198 209 253   Cardiac Enzymes:  Lab 03/27/12 1236  CKTOTAL --  CKMB --  CKMBINDEX --  TROPONINI <0.30   BNP: BNP (last 3 results) No results found for this basename: PROBNP:3 in the last 8760 hours CBG: No results found for this basename: GLUCAP:5 in the last 168 hours     Signed:  MEMON,JEHANZEB  [facility] 03/29/2012, 12:47 PM

## 2012-03-30 DIAGNOSIS — J8410 Pulmonary fibrosis, unspecified: Secondary | ICD-10-CM

## 2012-03-30 MED ORDER — SODIUM CHLORIDE 0.9 % IJ SOLN
INTRAMUSCULAR | Status: AC
  Administered 2012-03-30: 12:00:00
  Filled 2012-03-30: qty 3

## 2012-03-30 MED ORDER — CLOPIDOGREL BISULFATE 75 MG PO TABS: 75.0000 mg | ORAL_TABLET | Freq: Every day | ORAL | Status: AC

## 2012-03-30 NOTE — Plan of Care (Signed)
Problem: Phase III Progression Outcomes Goal: Voiding independently Outcome: Completed/Met Date Met:  03/30/12 Calls for bedpan

## 2012-03-30 NOTE — Progress Notes (Signed)
[facility]             Progress Note   Subjective: No complaints, only has pain on movement, otherwise pain free  Objective: Vital signs in last 24 hours: Temp:  [97.9 F (36.6 C)-98.4 F (36.9 C)] 97.9 F (36.6 C) (11/10 0511) Pulse Rate:  [92-93] 92  (11/10 0511) Resp:  [16-18] 16  (11/10 0511) BP: (120-124)/(74-86) 124/86 mmHg (11/10 0511) SpO2:  [98 %-100 %] 99 % (11/10 0734) Weight change:  Last BM Date: 03/29/12  Intake/Output from previous day: 11/09 0701 - 11/10 0700 In: 1720 [P.O.:720; I.V.:1000] Out: 5 [Urine:4; Stool:1] Total I/O In: 2035 [I.V.:2035] Out: -    Physical Exam: General: Alert, awake, oriented x3, in no acute distress. HEENT: No bruits, no goiter. Heart: Regular rate and rhythm, without murmurs, rubs, gallops. Lungs: fine crackles b/l Abdomen: Soft, nontender, nondistended, positive bowel sounds. Extremities: No clubbing cyanosis or edema with positive pedal pulses. Neuro: Grossly intact, nonfocal.    Lab Results: Basic Metabolic Panel:  Basename 03/28/12 0536  NA 137  K 4.3  CL 98  CO2 31  GLUCOSE 114*  BUN 14  CREATININE 0.68  CALCIUM 8.9  MG --  PHOS --   Liver Function Tests: No results found for this basename: AST:2,ALT:2,ALKPHOS:2,BILITOT:2,PROT:2,ALBUMIN:2 in the last 72 hours No results found for this basename: LIPASE:2,AMYLASE:2 in the last 72 hours No results found for this basename: AMMONIA:2 in the last 72 hours CBC:  Basename 03/29/12 0645 03/28/12 0536  WBC 10.9* 9.0  NEUTROABS -- --  HGB 11.1* 10.9*  HCT 33.6* 34.1*  MCV 94.1 94.5  PLT 198 209   Cardiac Enzymes: No results found for this basename: CKTOTAL:3,CKMB:3,CKMBINDEX:3,TROPONINI:3 in the last 72 hours BNP: No results found for this basename: PROBNP:3 in the last 72 hours D-Dimer: No results found for this basename: DDIMER:2 in the last 72 hours CBG: No results found for this basename: GLUCAP:6 in the last 72 hours Hemoglobin  A1C: No results found for this basename: HGBA1C in the last 72 hours Fasting Lipid Panel: No results found for this basename: CHOL,HDL,LDLCALC,TRIG,CHOLHDL,LDLDIRECT in the last 72 hours Thyroid Function Tests: No results found for this basename: TSH,T4TOTAL,FREET4,T3FREE,THYROIDAB in the last 72 hours Anemia Panel: No results found for this basename: VITAMINB12,FOLATE,FERRITIN,TIBC,IRON,RETICCTPCT in the last 72 hours Coagulation: No results found for this basename: LABPROT:2,INR:2 in the last 72 hours Urine Drug Screen: Drugs of Abuse  No results found for this basename: labopia,  cocainscrnur,  labbenz,  amphetmu,  thcu,  labbarb    Alcohol Level: No results found for this basename: ETH:2 in the last 72 hours Urinalysis: No results found for this basename: COLORURINE:2,APPERANCEUR:2,LABSPEC:2,PHURINE:2,GLUCOSEU:2,HGBUR:2,BILIRUBINUR:2,KETONESUR:2,PROTEINUR:2,UROBILINOGEN:2,NITRITE:2,LEUKOCYTESUR:2 in the last 72 hours  No results found for this or any previous visit (from the past 240 hour(s)).  Studies/Results: No results found.  Medications: Scheduled Meds:    . antiseptic oral rinse  15 mL Mouth Rinse BID  . bimatoprost  1 drop Both Eyes QHS  . timolol  1 drop Both Eyes BID   And  . brimonidine  1 drop Both Eyes BID  . brinzolamide  1 drop Both Eyes BID  . digoxin  0.125 mg Oral Daily  . feeding supplement  237 mL Oral BID BM  . predniSONE  10 mg Oral Daily  . [COMPLETED] sodium chloride       Continuous Infusions:    . sodium chloride 100 mL/hr at 03/29/12 2109   PRN Meds:.acetaminophen, acetaminophen, albuterol, HYDROmorphone (DILAUDID) injection, ondansetron (ZOFRAN) IV,  ondansetron  Assessment/Plan:  Principal Problem:  *Pelvic fracture Active Problems:  History of atrial fibrillation  Pulmonary fibrosis  Chronic respiratory failure  1. Pelvic fracture. Appreciate Dr. Sanjuan Dame input.  Management will be non operative.  Needs physical therapy.  2.  Hematoma lateral to iliac bone. S/p fall.  Patient was on aspirin and plavix prior to admission which have both been held. Follow up CBC shows stable hemoglobin.  3. Chronic resp failure, on oxygen and prednisone, stable  4. Dispo. Patient will be discharged to skilled nursing facility today for physical therapy  Time spent coordinating care:   LOS: 3 days   MEMON,JEHANZEB [facility] Pager: [phone] 03/30/2012, 2:16 PM

## 2012-03-30 NOTE — Plan of Care (Signed)
Problem: Phase I Progression Outcomes Goal: OOB as tolerated unless otherwise ordered Outcome: Completed/Met Date Met:  03/30/12 Up to chair this morning

## 2012-03-30 NOTE — Progress Notes (Signed)
Report called to Crystal at [facility]. IV d/c. Pt d/c via stretcher by EMS, pt comfortable and consenting to transfer. D/c summary faxed to [facility]. Prescription for vicodin sent with EMS, along with pts eye gtts, [facility] aware of this. Sheryn Bison

## 2012-03-30 NOTE — Discharge Summary (Addendum)
Physician Discharge Summary    Tabitha [name] ZOX:[id number] DOB: February 11, 1918 DOA: 03/27/2012   PCP: Marlane Hatcher, MD   Admit date: 03/27/2012  Discharge date: 03/29/2012   Time spent: 35 minutes   Recommendations for Outpatient Follow-up:  1. Patient will be sent to a skilled nursing facility for physical therapy.  Discharge Diagnoses:  Principal Problem:  *Pelvic fracture  Active Problems:  History of atrial fibrillation  Pulmonary fibrosis  Chronic respiratory failure  Hematoma lateral to iliac bone.  Fall   Discharge Condition: improved   Diet recommendation: low salt  Filed Weights    03/27/12 1122  03/27/12 1637   Weight:  45.36 kg (100 lb)  48.9 kg (107 lb 12.9 oz)     History of present illness:  Tabitha [name] is a 78 y.o. female who was at home today and had a fall. She denies any LOC, head trauma. She said she did feel a little dizzy prior to falling, but feels that she turned too quickly, lost her balance and fell. She denies any chest pain or worsening trouble breathing. She does have pulmonary fibrosis and is chronically on oxygen and prednisone. She was evaluated in the ED and found to have a pelvic fracture.   Hospital Course:  This lady was admitted to the hospital after having a fall and suffering from a pelvic fracture. She was seen by Dr. Hilda Lias who did not recommend any surgical management. She was seen by physical therapy who recommended SNF placement for patient. Her main complaint is pain at this time, which appears to be reasonably controlled.  She was found to have a hematoma lateral to iliac bone. CBCs did not show a significant decline in hemoglobin. She should restart her Plavix, which is used for secondary stroke prevention, on 11/15. She was mildly dehydrated on admission, this corrected with IV fluids. She is felt stable to discharge to SNF today.   Procedures:  none  Consultations:  Orthopedics. Dr. Hilda Lias  Discharge Exam:  Filed  Vitals:    03/28/12 2147  03/28/12 2349  03/29/12 0609  03/29/12 0743   BP:  120/81   136/82    Pulse:  103   54    Temp:  97.8 F (36.6 C)   97.9 F (36.6 C)    TempSrc:  Oral   Oral    Resp:  18   18    Height:       Weight:       SpO2:  95%  95%  99%  98%    General: NAD  Cardiovascular: s1, s2, rrr  Respiratory: cta b   Discharge Instructions  Discharge Orders    Future Orders  Please Complete By  Expires    Diet - low sodium heart healthy      Increase activity slowly            Medication List     As of 03/30/2012  2:50 PM    START taking these medications         clopidogrel 75 MG tablet   Commonly known as: PLAVIX   Take 1 tablet (75 mg total) by mouth daily. Start on 11/15      HYDROcodone-acetaminophen 5-500 MG per tablet   Commonly known as: VICODIN   Take 1 tablet by mouth every 8 (eight) hours as needed for pain.      CONTINUE taking these medications         bimatoprost 0.01 % Soln  Commonly known as: LUMIGAN      brinzolamide 1 % ophthalmic suspension   Commonly known as: AZOPT      COMBIGAN 0.2-0.5 % ophthalmic solution   Generic drug: brimonidine-timolol      digoxin 0.125 MG tablet   Commonly known as: LANOXIN      predniSONE 5 MG tablet   Commonly known as: DELTASONE          Where to get your medications    These are the prescriptions that you need to pick up.   You may get these medications from any pharmacy.         HYDROcodone-acetaminophen 5-500 MG per tablet         Information on where to get these meds is not yet available. Ask your nurse or doctor.         clopidogrel 75 MG tablet                                                                                                     The results of significant diagnostics from this hospitalization (including imaging, microbiology, ancillary and laboratory) are listed below for reference.    Significant Diagnostic Studies:  Dg Chest 1 View   03/27/2012 *RADIOLOGY REPORT* Clinical Data: Post fall CHEST - 1 VIEW Comparison: 03/31/2011; 11/11/2008; chest CT - 12/13/2008 Findings: Grossly unchanged enlarged cardiac silhouette and mediastinal contours post median sternotomy and valve replacement. The lungs remain hyperexpanded. Grossly unchanged extensive bilateral coarse reticular opacities. Unchanged biapical pleural parenchymal thickening. There is chronic blunting left costophrenic angle without definite pleural effusion. No new discrete focal airspace opacities. No definite pneumothorax. Unchanged bones. IMPRESSION: Grossly stable findings of advanced emphysematous change and fibrosis without definite superimposed acute cardiopulmonary disease. Original Report Authenticated By: Tacey Ruiz, MD  Dg Hip Complete Right  03/27/2012 *RADIOLOGY REPORT* Clinical Data: Fall RIGHT HIP - COMPLETE 2+ VIEW Comparison: None. Findings: There is a minimally displaced fracture of the left pubic body as well as the left superior and inferior pubic rami. There are irregular sclerotic and linear opacities projecting over the right iliac bone. The right iliac also has an abnormal appearance and a complex fracture involving the right iliac bone may be present. Sacrum is grossly intact. Right femoral neck is grossly intact. The right acetabulum is grossly intact. Degenerative changes in the lumbar spine with L4-5 levoscoliosis. IMPRESSION: Fracture of the left pubic rami. Complex fracture involving the right iliac bone is suspected. CT pelvis is recommended. Original Report Authenticated By: Jolaine Click, M.D.  Ct Head Wo Contrast  03/27/2012 *RADIOLOGY REPORT* Clinical Data: Fall, history of stroke, on Plavix CT HEAD WITHOUT CONTRAST Technique: Contiguous axial images were obtained from the base of the skull through the vertex without contrast. Comparison: 03/31/2011 Findings: Generalized atrophy. Normal ventricular morphology. No midline shift or mass effect. Small  vessel chronic ischemic changes of deep cerebral white matter. No intracranial hemorrhage, mass lesion, or acute infarction. Increased opacification of the left mastoid air cells with increased opacification at the left middle ear cavity as well. Visualized paranasal sinuses and right  mastoid air cells clear. Bones demineralized with degenerative changes at the anterior arch of C1. Atherosclerotic calcifications at skull base. IMPRESSION: Atrophy with small vessel chronic ischemic changes of deep cerebral white matter. No acute intracranial abnormalities. Left mastoid and middle ear cavity opacification, increased. Original Report Authenticated By: Ulyses Southward, M.D.  Ct Pelvis Wo Contrast  03/27/2012 *RADIOLOGY REPORT* Clinical Data: Fall, hip pain CT PELVIS WITHOUT CONTRAST Technique: Multidetector CT imaging of the pelvis was performed following the standard protocol without intravenous contrast. Comparison: Plain films same day Findings: Diffuse osteopenia is noted. Degenerative changes are noted lower lumbar spine. No sacral fracture is identified. There is a comminuted displaced fracture of the right iliac bone. This is best seen in axial image 73. There is small adjacent hematoma just lateral to the iliac bone. No significant intrapelvic hematoma at this level. Again noted mild displaced fracture of the left superior pubic ramus adjacent and progressing in pubic symphysis. Best seen in axial image 28. There is mild displaced comminuted fracture of the left inferior pubic ramus at the level of pubic symphysis. The urinary bladder is unremarkable. There is small anterior pelvic hematoma axial image 86 measures 3.2 x 4.4 cm. Atrophic uterus is noted. No hip fracture or subluxation. No acetabular fracture. IMPRESSION: 1. There is a comminuted displaced fracture of the right iliac bone. Small amount of hematoma is noted just lateral to the iliac bone. No intrapelvic hematoma at this level. 2. Diffuse osteopenia is  noted. Mild displaced fracture at pubic symphysis of the left superior pubic ramus. There is displaced comminuted fracture of the left inferior pubic ramus at the level of the pubic symphysis. There is small anterior pelvic hematoma at the level of the pubic symphysis measures 3.20 x 4.4 cm. 3. No acetabular fracture. No hip fracture. No sacral fracture. Original Report Authenticated By: Natasha Mead, M.D.   Microbiology:  No results found for this or any previous visit (from the past 240 hour(s)).   Labs:  Basic Metabolic Panel:   Lab  03/28/12 0536  03/27/12 1236   NA  137  139   K  4.3  3.5   CL  98  97   CO2  31  33*   GLUCOSE  114*  100*   BUN  14  14   CREATININE  0.68  0.76   CALCIUM  8.9  9.3   MG  --  --   PHOS  --  --    Liver Function Tests:   Lab  03/27/12 1236   AST  23   ALT  19   ALKPHOS  47   BILITOT  0.5   PROT  6.5   ALBUMIN  3.3*    No results found for this basename: LIPASE:5,AMYLASE:5 in the last 168 hours  No results found for this basename: AMMONIA:5 in the last 168 hours  CBC:   Lab  03/29/12 0645  03/28/12 0536  03/27/12 1236   WBC  10.9*  9.0  12.7*   NEUTROABS  --  --  9.2*   HGB  11.1*  10.9*  12.4   HCT  33.6*  34.1*  38.4   MCV  94.1  94.5  95.0   PLT  198  209  253    Cardiac Enzymes:   Lab  03/27/12 1236   CKTOTAL  --   CKMB  --   CKMBINDEX  --   TROPONINI  <0.30    BNP:  BNP (last 3  results)  No results found for this basename: PROBNP:3 in the last 8760 hours  CBG:  No results found for this basename: GLUCAP:5 in the last 168 hours   Signed:   MEMON,JEHANZEB  [facility]  03/29/2012, 12:47 PM

## 2012-04-10 ENCOUNTER — Ambulatory Visit (INDEPENDENT_AMBULATORY_CARE_PROVIDER_SITE_OTHER): Payer: Medicare Other | Admitting: Otolaryngology

## 2012-06-30 NOTE — Progress Notes (Signed)
Pulmonary Rehabilitation Program Outcomes Report   Orientation:  08/09/2011 Halfway report: 10/22/2011 Graduate Date:  tbd Discharge Date:  tbd # of sessions completed: 12 DX: Pumonary Fibrosis  Pulmonologist: Hawkns Family MD:  Maxine Glenn Time:  !3:00  A.  Exercise Program:  Tolerates exercise @ 1.3 METS for 15 minutes  B.  Mental Health:  Good mental attitude  C.  Education/Instruction/Skills  Accurately checks own pulse.  Rest:  82  Exercise:  60, Knows THR for exercise and Uses Perceived Exertion Scale and/or Dyspnea Scale  Uses Perceived Exertion Scale and/or Dyspnea Scale  D.  Nutrition/Weight Control/Body Composition:  Adherence to prescribed nutrition program: good    E.  Blood Lipids    Lab Results  Component Value Date   CHOL 165 04/01/2011   HDL 52 04/01/2011   LDLCALC 100* 04/01/2011   TRIG 66 04/01/2011   CHOLHDL 3.2 04/01/2011    F.  Lifestyle Changes:  Making positive lifestyle changes and Participated in Quit Smart smoking cessation program  G.  Symptoms noted with exercise:  Asymptomatic  Report Completed By:  Lelon Huh. Sueo Cullen RN  Comments:   This is patients halfway Report. She Achieved a peak METS of 1.3. Her resting HR was 60 and BP of 90/60,  Her Peak HR was 60 and Peak BP was 90/60.a graduation report will follow on her 24th visit.

## 2012-06-30 NOTE — Progress Notes (Signed)
Pulmonary Rehabilitation Program Outcomes Report   Orientation:  08/09/2011 1st week Report: 08/27/2011 Graduate Date:  tbd Discharge Date:  tbd # of sessions completed: 3 DX: Pulmonary Fibrosis  Pulmonologist: Juanetta Gosling Family MD:  Maxine Glenn Time:  13:00  A.  Exercise Program:  Tolerates exercise @ 1.1 METS for 15 minutes  B.  Mental Health:  Good mental attitude  C.  Education/Instruction/Skills  Accurately checks own pulse.  Rest:  89  Exercise:  87, Knows THR for exercise and Uses Perceived Exertion Scale and/or Dyspnea Scale  Uses Perceived Exertion Scale and/or Dyspnea Scale  D.  Nutrition/Weight Control/Body Composition:  Adherence to prescribed nutrition program: good    E.  Blood Lipids    Lab Results  Component Value Date   CHOL 165 04/01/2011   HDL 52 04/01/2011   LDLCALC 100* 04/01/2011   TRIG 66 04/01/2011   CHOLHDL 3.2 04/01/2011    F.  Lifestyle Changes:  Making positive lifestyle changes  G.  Symptoms noted with exercise:  Asymptomatic  Report Completed By: Lelon Huh. Brookie Wayment RN   Comments:  This is patients 1st week report. She achieved a peak METS of 1.1. Her resting HR was 89 and her resting Bp was 140/70 and her Peak HR was 87 and peak BP was 142/60. A report will follow on her half way point her 12 th visit.

## 2012-07-08 ENCOUNTER — Telehealth: Payer: Self-pay | Admitting: Cardiology

## 2012-07-08 NOTE — Telephone Encounter (Signed)
PT LAST SEEN DR WALL 2012 AND SHE IS FEELING VERY FATIGUE, I HAVE SCHEDULED A APPOINTMENT TO SEE MICHELLE LENZE 07/16/12 (THIS IS THE VERY FIRST THING WE HAVE).

## 2012-07-08 NOTE — Telephone Encounter (Signed)
Called pt home and spoke to pt advocate Blenda Mounts clarified that the pt is asymptomatic with only the fatigue noted, denied chest/arm/jaw/neck pain/sob, advised pt could also be seen by PCP sooner for this concern, at this time pt still wants to come to our office on 07-16-12 with ML, will call our office if she changes her mind and go to ED if any sxs worsen.

## 2012-07-16 ENCOUNTER — Ambulatory Visit: Payer: Medicare Other | Admitting: Physician Assistant

## 2012-08-13 ENCOUNTER — Telehealth: Payer: Self-pay | Admitting: Cardiology

## 2012-08-13 MED ORDER — DIGOXIN 125 MCG PO TABS: 0.1250 mg | ORAL_TABLET | Freq: Every day | ORAL | Status: DC

## 2012-08-13 NOTE — Telephone Encounter (Signed)
Patient calling for refill digoxin (LANOXIN) 0.125 MG tablet

## 2012-08-14 ENCOUNTER — Ambulatory Visit (INDEPENDENT_AMBULATORY_CARE_PROVIDER_SITE_OTHER): Payer: Medicare Other | Admitting: Otolaryngology

## 2012-08-14 DIAGNOSIS — H6980 Other specified disorders of Eustachian tube, unspecified ear: Secondary | ICD-10-CM

## 2012-08-14 DIAGNOSIS — H6120 Impacted cerumen, unspecified ear: Secondary | ICD-10-CM

## 2012-08-14 DIAGNOSIS — H908 Mixed conductive and sensorineural hearing loss, unspecified: Secondary | ICD-10-CM

## 2012-09-02 ENCOUNTER — Other Ambulatory Visit (HOSPITAL_COMMUNITY): Payer: Self-pay | Admitting: Pulmonary Disease

## 2012-09-02 DIAGNOSIS — Z139 Encounter for screening, unspecified: Secondary | ICD-10-CM

## 2012-09-09 ENCOUNTER — Ambulatory Visit (HOSPITAL_COMMUNITY)
Admission: RE | Admit: 2012-09-09 | Discharge: 2012-09-09 | Disposition: A | Discharge status: Home / Self Care | Payer: Medicare Other | Source: Ambulatory Visit | Attending: Pulmonary Disease | Admitting: Pulmonary Disease

## 2012-09-09 DIAGNOSIS — Z139 Encounter for screening, unspecified: Secondary | ICD-10-CM

## 2012-09-09 DIAGNOSIS — Z780 Asymptomatic menopausal state: Secondary | ICD-10-CM | POA: Insufficient documentation

## 2012-09-09 DIAGNOSIS — Z13820 Encounter for screening for osteoporosis: Secondary | ICD-10-CM | POA: Insufficient documentation

## 2012-10-21 ENCOUNTER — Ambulatory Visit (INDEPENDENT_AMBULATORY_CARE_PROVIDER_SITE_OTHER): Payer: Medicare Other | Admitting: Cardiology

## 2012-10-21 ENCOUNTER — Encounter: Payer: Self-pay | Admitting: Cardiology

## 2012-10-21 VITALS — BP 112/60 | HR 87 | Ht 63.0 in | Wt 91.0 lb

## 2012-10-21 DIAGNOSIS — Z8679 Personal history of other diseases of the circulatory system: Secondary | ICD-10-CM

## 2012-10-21 DIAGNOSIS — I69922 Dysarthria following unspecified cerebrovascular disease: Secondary | ICD-10-CM

## 2012-10-21 DIAGNOSIS — I2510 Atherosclerotic heart disease of native coronary artery without angina pectoris: Secondary | ICD-10-CM

## 2012-10-21 DIAGNOSIS — R0902 Hypoxemia: Secondary | ICD-10-CM

## 2012-10-21 DIAGNOSIS — G8191 Hemiplegia, unspecified affecting right dominant side: Secondary | ICD-10-CM

## 2012-10-21 DIAGNOSIS — J8410 Pulmonary fibrosis, unspecified: Secondary | ICD-10-CM

## 2012-10-21 DIAGNOSIS — IMO0002 Reserved for concepts with insufficient information to code with codable children: Secondary | ICD-10-CM

## 2012-10-21 DIAGNOSIS — I080 Rheumatic disorders of both mitral and aortic valves: Secondary | ICD-10-CM

## 2012-10-21 DIAGNOSIS — I639 Cerebral infarction, unspecified: Secondary | ICD-10-CM

## 2012-10-21 DIAGNOSIS — I6330 Cerebral infarction due to thrombosis of unspecified cerebral artery: Secondary | ICD-10-CM

## 2012-10-21 DIAGNOSIS — G8190 Hemiplegia, unspecified affecting unspecified side: Secondary | ICD-10-CM

## 2012-10-21 DIAGNOSIS — I5032 Chronic diastolic (congestive) heart failure: Secondary | ICD-10-CM

## 2012-10-21 NOTE — Assessment & Plan Note (Signed)
No recurrence. Because of falls, not a candidate for anticoagulation.
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
## 2012-10-21 NOTE — Patient Instructions (Addendum)
Order given to the patient for lift chair.   Your physician wants you to follow-up in: 1 YEAR with Dr Wyline Mood. You will receive a reminder letter in the mail two months in advance. If you don't receive a letter, please call our office to schedule the follow-up appointment.  Your physician recommends that you continue on your current medications as directed. Please refer to the Current Medication list given to you today.

## 2012-10-21 NOTE — Assessment & Plan Note (Signed)
Stable. No change in current medical management.

## 2012-11-20 ENCOUNTER — Emergency Department (HOSPITAL_COMMUNITY): Payer: Medicare Other

## 2012-11-20 ENCOUNTER — Encounter (HOSPITAL_COMMUNITY): Payer: Self-pay

## 2012-11-20 ENCOUNTER — Inpatient Hospital Stay (HOSPITAL_COMMUNITY)
Admission: EM | Admit: 2012-11-20 | Discharge: 2012-11-24 | DRG: 291 | Disposition: A | Discharge status: Home Health | Payer: Medicare Other | Attending: Pulmonary Disease | Admitting: Pulmonary Disease

## 2012-11-20 DIAGNOSIS — I2510 Atherosclerotic heart disease of native coronary artery without angina pectoris: Secondary | ICD-10-CM | POA: Diagnosis present

## 2012-11-20 DIAGNOSIS — E43 Unspecified severe protein-calorie malnutrition: Secondary | ICD-10-CM | POA: Insufficient documentation

## 2012-11-20 DIAGNOSIS — I4891 Unspecified atrial fibrillation: Secondary | ICD-10-CM | POA: Diagnosis present

## 2012-11-20 DIAGNOSIS — Z7902 Long term (current) use of antithrombotics/antiplatelets: Secondary | ICD-10-CM

## 2012-11-20 DIAGNOSIS — I5033 Acute on chronic diastolic (congestive) heart failure: Principal | ICD-10-CM | POA: Diagnosis present

## 2012-11-20 DIAGNOSIS — I369 Nonrheumatic tricuspid valve disorder, unspecified: Secondary | ICD-10-CM

## 2012-11-20 DIAGNOSIS — Z954 Presence of other heart-valve replacement: Secondary | ICD-10-CM

## 2012-11-20 DIAGNOSIS — I509 Heart failure, unspecified: Secondary | ICD-10-CM | POA: Diagnosis present

## 2012-11-20 DIAGNOSIS — I69959 Hemiplegia and hemiparesis following unspecified cerebrovascular disease affecting unspecified side: Secondary | ICD-10-CM

## 2012-11-20 DIAGNOSIS — R001 Bradycardia, unspecified: Secondary | ICD-10-CM

## 2012-11-20 DIAGNOSIS — M1990 Unspecified osteoarthritis, unspecified site: Secondary | ICD-10-CM | POA: Diagnosis present

## 2012-11-20 DIAGNOSIS — H409 Unspecified glaucoma: Secondary | ICD-10-CM | POA: Diagnosis present

## 2012-11-20 DIAGNOSIS — I059 Rheumatic mitral valve disease, unspecified: Secondary | ICD-10-CM | POA: Diagnosis present

## 2012-11-20 DIAGNOSIS — I5040 Unspecified combined systolic (congestive) and diastolic (congestive) heart failure: Secondary | ICD-10-CM

## 2012-11-20 DIAGNOSIS — I5021 Acute systolic (congestive) heart failure: Secondary | ICD-10-CM

## 2012-11-20 DIAGNOSIS — Z880 Allergy status to penicillin: Secondary | ICD-10-CM

## 2012-11-20 DIAGNOSIS — M810 Age-related osteoporosis without current pathological fracture: Secondary | ICD-10-CM | POA: Diagnosis present

## 2012-11-20 DIAGNOSIS — I5032 Chronic diastolic (congestive) heart failure: Secondary | ICD-10-CM

## 2012-11-20 DIAGNOSIS — Z8679 Personal history of other diseases of the circulatory system: Secondary | ICD-10-CM

## 2012-11-20 DIAGNOSIS — Z681 Body mass index (BMI) 19 or less, adult: Secondary | ICD-10-CM

## 2012-11-20 DIAGNOSIS — IMO0002 Reserved for concepts with insufficient information to code with codable children: Secondary | ICD-10-CM

## 2012-11-20 DIAGNOSIS — J8410 Pulmonary fibrosis, unspecified: Secondary | ICD-10-CM | POA: Diagnosis present

## 2012-11-20 DIAGNOSIS — R0902 Hypoxemia: Secondary | ICD-10-CM | POA: Diagnosis present

## 2012-11-20 DIAGNOSIS — Z79899 Other long term (current) drug therapy: Secondary | ICD-10-CM

## 2012-11-20 DIAGNOSIS — E785 Hyperlipidemia, unspecified: Secondary | ICD-10-CM | POA: Diagnosis present

## 2012-11-20 DIAGNOSIS — J9610 Chronic respiratory failure, unspecified whether with hypoxia or hypercapnia: Secondary | ICD-10-CM

## 2012-11-20 DIAGNOSIS — Z9981 Dependence on supplemental oxygen: Secondary | ICD-10-CM

## 2012-11-20 DIAGNOSIS — I498 Other specified cardiac arrhythmias: Secondary | ICD-10-CM

## 2012-11-20 LAB — CBC WITH DIFFERENTIAL/PLATELET
Basophils Absolute: 0 10*3/uL (ref 0.0–0.1)
Basophils Relative: 0 % (ref 0–1)
HCT: 44.2 % (ref 36.0–46.0)
MCHC: 32.4 g/dL (ref 30.0–36.0)
Monocytes Absolute: 0.6 10*3/uL (ref 0.1–1.0)
Neutro Abs: 11.4 10*3/uL — ABNORMAL HIGH (ref 1.7–7.7)
Neutrophils Relative %: 88 % — ABNORMAL HIGH (ref 43–77)
RDW: 15.6 % — ABNORMAL HIGH (ref 11.5–15.5)

## 2012-11-20 LAB — DIGOXIN LEVEL: Digoxin Level: 1.2 ng/mL (ref 0.8–2.0)

## 2012-11-20 LAB — BASIC METABOLIC PANEL
CO2: 34 mEq/L — ABNORMAL HIGH (ref 19–32)
Calcium: 9.5 mg/dL (ref 8.4–10.5)
Chloride: 99 mEq/L (ref 96–112)
Glucose, Bld: 124 mg/dL — ABNORMAL HIGH (ref 70–99)
Potassium: 4.2 mEq/L (ref 3.5–5.1)
Sodium: 139 mEq/L (ref 135–145)

## 2012-11-20 LAB — PRO B NATRIURETIC PEPTIDE: Pro B Natriuretic peptide (BNP): 7386 pg/mL — ABNORMAL HIGH (ref 0–450)

## 2012-11-20 LAB — TROPONIN I: Troponin I: <0.3 ng/mL (ref <0.30)

## 2012-11-20 MED ORDER — PREDNISONE 10 MG PO TABS: 10.0000 mg | ORAL_TABLET | Freq: Every day | ORAL | Status: DC

## 2012-11-20 MED ORDER — DIGOXIN 250 MCG PO TABS
0.2500 mg | ORAL_TABLET | Freq: Every day | ORAL | Status: DC
  Administered 2012-11-21 – 2012-11-22 (×2): 0.25 mg via ORAL
  Filled 2012-11-20 (×4): qty 1

## 2012-11-20 MED ORDER — CLOPIDOGREL BISULFATE 75 MG PO TABS
75.0000 mg | ORAL_TABLET | Freq: Every day | ORAL | Status: DC
  Administered 2012-11-21 – 2012-11-24 (×4): 75 mg via ORAL
  Filled 2012-11-20 (×4): qty 1

## 2012-11-20 MED ORDER — HEPARIN SODIUM (PORCINE) 5000 UNIT/ML IJ SOLN
5000.0000 [IU] | Freq: Three times a day (TID) | INTRAMUSCULAR | Status: DC
  Administered 2012-11-20 – 2012-11-24 (×12): 5000 [IU] via SUBCUTANEOUS
  Filled 2012-11-20 (×13): qty 1

## 2012-11-20 MED ORDER — DORZOLAMIDE HCL 2 % OP SOLN
1.0000 [drp] | Freq: Two times a day (BID) | OPHTHALMIC | Status: DC
  Administered 2012-11-20 – 2012-11-24 (×9): 1 [drp] via OPHTHALMIC
  Filled 2012-11-20: qty 10

## 2012-11-20 MED ORDER — BIMATOPROST 0.01 % OP SOLN
1.0000 [drp] | Freq: Every day | OPHTHALMIC | Status: DC
  Administered 2012-11-20 – 2012-11-23 (×4): 1 [drp] via OPHTHALMIC
  Filled 2012-11-20: qty 2.5

## 2012-11-20 MED ORDER — DIGOXIN 125 MCG PO TABS: 0.1250 mg | ORAL_TABLET | Freq: Every day | ORAL | Status: DC

## 2012-11-20 MED ORDER — FUROSEMIDE 10 MG/ML IJ SOLN
20.0000 mg | Freq: Two times a day (BID) | INTRAMUSCULAR | Status: DC
  Administered 2012-11-20 – 2012-11-24 (×8): 20 mg via INTRAVENOUS
  Filled 2012-11-20 (×8): qty 2

## 2012-11-20 MED ORDER — MIDODRINE HCL 5 MG PO TABS
5.0000 mg | ORAL_TABLET | Freq: Three times a day (TID) | ORAL | Status: DC
  Administered 2012-11-21 – 2012-11-24 (×10): 5 mg via ORAL
  Filled 2012-11-20 (×17): qty 1

## 2012-11-20 MED ORDER — METOPROLOL TARTRATE 1 MG/ML IV SOLN
2.5000 mg | Freq: Four times a day (QID) | INTRAVENOUS | Status: DC
  Administered 2012-11-20 – 2012-11-21 (×4): 2.5 mg via INTRAVENOUS
  Filled 2012-11-20 (×4): qty 5

## 2012-11-20 MED ORDER — POTASSIUM CHLORIDE CRYS ER 10 MEQ PO TBCR
10.0000 meq | EXTENDED_RELEASE_TABLET | Freq: Every day | ORAL | Status: DC
  Administered 2012-11-20 – 2012-11-24 (×5): 10 meq via ORAL
  Filled 2012-11-20 (×5): qty 1

## 2012-11-20 MED ORDER — MIDODRINE HCL 5 MG PO TABS
2.5000 mg | ORAL_TABLET | Freq: Three times a day (TID) | ORAL | Status: DC
  Administered 2012-11-20: 2.5 mg via ORAL
  Filled 2012-11-20 (×3): qty 1

## 2012-11-20 MED ORDER — PREDNISONE 10 MG PO TABS
15.0000 mg | ORAL_TABLET | Freq: Every day | ORAL | Status: DC
  Administered 2012-11-21 – 2012-11-24 (×4): 15 mg via ORAL
  Filled 2012-11-20 (×4): qty 2

## 2012-11-20 MED ORDER — SODIUM CHLORIDE 0.9 % IJ SOLN
3.0000 mL | Freq: Two times a day (BID) | INTRAMUSCULAR | Status: DC
  Administered 2012-11-20 – 2012-11-23 (×8): 3 mL via INTRAVENOUS
  Filled 2012-11-20: qty 3

## 2012-11-20 MED ORDER — FUROSEMIDE 10 MG/ML IJ SOLN
40.0000 mg | Freq: Once | INTRAMUSCULAR | Status: AC
  Administered 2012-11-20: 40 mg via INTRAVENOUS
  Filled 2012-11-20: qty 4

## 2012-11-20 NOTE — Progress Notes (Signed)
*  PRELIMINARY RESULTS* Echocardiogram 2D Echocardiogram has been performed.  Tabitha [name] 11/20/2012, 3:51 PM
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
## 2012-11-20 NOTE — ED Notes (Signed)
Attempted to call report to floor will return the call.
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
## 2012-11-20 NOTE — ED Notes (Signed)
Pt voided incont. On self, linens changed, pt given new gown. Declined offer for foley cath.
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
## 2012-11-20 NOTE — Progress Notes (Addendum)
Pt HR is sustained in the 140s. Pt is asymptomatic. She is having no SOB (with O2 via [place]), no dizziness, no chest pain. MD asked for cardiology consult to be called in. Will continue to monitor.

## 2012-11-20 NOTE — ED Notes (Signed)
Sitter said when pt woke up this morning around 0900 she was congested and SOB. Pt is on home 02 @ 3 liters.
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
## 2012-11-21 ENCOUNTER — Inpatient Hospital Stay (HOSPITAL_COMMUNITY): Payer: Medicare Other

## 2012-11-21 LAB — BASIC METABOLIC PANEL
Chloride: 97 mEq/L (ref 96–112)
Creatinine, Ser: 1.01 mg/dL (ref 0.50–1.10)
GFR calc Af Amer: 54 mL/min — ABNORMAL LOW (ref >90)
Sodium: 140 mEq/L (ref 135–145)

## 2012-11-21 MED ORDER — DIPHENHYDRAMINE HCL 25 MG PO CAPS
25.0000 mg | ORAL_CAPSULE | Freq: Three times a day (TID) | ORAL | Status: DC | PRN
  Administered 2012-11-21 (×2): 25 mg via ORAL
  Filled 2012-11-21 (×2): qty 1

## 2012-11-21 MED ORDER — METOPROLOL TARTRATE 25 MG PO TABS
25.0000 mg | ORAL_TABLET | Freq: Two times a day (BID) | ORAL | Status: DC
  Administered 2012-11-21 – 2012-11-24 (×6): 25 mg via ORAL
  Filled 2012-11-21 (×7): qty 1

## 2012-11-21 NOTE — Progress Notes (Signed)
Patient admitted yesterday for CHF, now developed rash. It does not itch, rash is typical drug reaction with target lesions.  Patient has allergy to penicillin, will start benadryl prn, she is already on prednisone.

## 2012-11-21 NOTE — Progress Notes (Signed)
[id number] 

## 2012-11-21 NOTE — Progress Notes (Signed)
Tabitha [name], DURA              ACCOUNT NO.:  [id number]  MEDICAL RECORD NO.:  [id number]  LOCATION:  A334                          FACILITY:  [facility]  PHYSICIAN:  Melvyn Novas, MDDATE OF BIRTH:  October 27, 1917  DATE OF PROCEDURE: DATE OF DISCHARGE:                                PROGRESS NOTE   SUBJECTIVE:  The patient is an 79 year old frail white female with status post mitral valve repair in 2008, essentially admitted with increasing dyspnea, CHF, elevated BNP in the 7000 with AFib which is chronic, but more rapid ventricular response with diminished diastolic filling, due to rapid rate.  The patient is being diuresed and have added increased dig from 0.125 to 25 for rate control.  We will have the addition of Lopressor 25 p.o. b.i.d. for the same issues.  A  2-D echo reveals poor acoustic window.  However, left ventricular systolic function appears to be diminished and the right ventricle appears to be normal in function with no evidence of jugular venous distention to go along with chronic congestive heart failure.  Chest x-ray reveals chronic pulmonary fibrosis, and question of superimposed and interval worsening of CHF.  OBJECTIVE:  VITAL SIGNS:  Blood pressure is 122/74, pulse is 75 and irregular regular on monitor, temperature 97.5, and O2 saturation 98% on 2 L. LUNGS:  Show diminished breath sounds at the bases with prolonged inspiratory and expiratory phase.  No rales audible.  No wheeze audible. Heart:  Irregularly regular.  No S3 audible.  No heaves thrills or rubs. EXTREMITIES:  1+ chronic pedal edema.  LABORATORY DATA:  Potassium 3.7, creatinine 1.01.  PLAN:  Right now is to add Lopressor 25 p.o. b.i.d. for better rate control.  Continue Lasix 20 IV b.i.d. for diuresis.  Monitor renal function, potassium, magnesium as well as hepatic function for chronic congestive heart failure.  We will make further recommendations as the database expands.  We will  also get chest x-ray, PA and lateral, to have more clinical data as to degree of CHF upon superimposed on pulmonary fibrosis.     Melvyn Novas, MD     RMD/MEDQ  D:  11/21/2012  T:  11/21/2012  Job:  [id number]

## 2012-11-21 NOTE — Care Management Note (Unsigned)
    Page 1 of 1   11/21/2012     5:34:55 PM   CARE MANAGEMENT NOTE 11/21/2012  Patient:  Tabitha [name], Tabitha [name]   Account Number:  [id number]  Date Initiated:  11/21/2012  Documentation initiated by:  Anibal Henderson  Subjective/Objective Assessment:   patient admitted with CHF. Patient lives at home and has private sitters around-the-clock. Is it was in the room with patient. Patient states she will be returning home at discharge, with sitters     Action/Plan:   patient has had the best homecare in the past and would like to have them come again to follow respiratory status   Anticipated DC Date:  11/22/2012   Anticipated DC Plan:  HOME W HOME HEALTH SERVICES      DC Planning Services  CM consult      [facility]  HOME HEALTH   Choice offered to / List presented to:  C-1 Patient        HH arranged  HH-1 RN  HH-10 DISEASE MANAGEMENT  HH-2 PT      HH agency  [facility]   Status of service:  In process, will continue to follow Medicare Important Message given?   (If response is "NO", the following Medicare IM given date fields will be blank) Date Medicare IM given:   Date Additional Medicare IM given:    Discharge Disposition:    Per UR Regulation:    If discussed at Long Length of Stay Meetings, dates discussed:    Comments:  11/21/12 1730 , Anibal Henderson RN/CM

## 2012-11-22 LAB — HEPATIC FUNCTION PANEL
Albumin: 3.2 g/dL — ABNORMAL LOW (ref 3.5–5.2)
Indirect Bilirubin: 0.3 mg/dL (ref 0.3–0.9)
Total Protein: 6.7 g/dL (ref 6.0–8.3)

## 2012-11-22 LAB — BASIC METABOLIC PANEL
Calcium: 9.2 mg/dL (ref 8.4–10.5)
Chloride: 99 mEq/L (ref 96–112)
Creatinine, Ser: 0.96 mg/dL (ref 0.50–1.10)
GFR calc Af Amer: 57 mL/min — ABNORMAL LOW (ref >90)
Sodium: 142 mEq/L (ref 135–145)

## 2012-11-22 LAB — MAGNESIUM: Magnesium: 2 mg/dL (ref 1.5–2.5)

## 2012-11-22 NOTE — Progress Notes (Signed)
Patient has a generalized rash, Md notified when patient was admitted and Benadryl was ordered. Patient continues to have rash, no complaints of itching today. Dr. Janna Arch made aware.

## 2012-11-22 NOTE — Progress Notes (Signed)
[id number] 

## 2012-11-23 LAB — HEPATIC FUNCTION PANEL
ALT: 21 U/L (ref 0–35)
AST: 22 U/L (ref 0–37)
Albumin: 3.1 g/dL — ABNORMAL LOW (ref 3.5–5.2)
Total Protein: 6.7 g/dL (ref 6.0–8.3)

## 2012-11-23 LAB — BASIC METABOLIC PANEL
Chloride: 97 mEq/L (ref 96–112)
GFR calc Af Amer: 58 mL/min — ABNORMAL LOW (ref >90)
Potassium: 3.6 mEq/L (ref 3.5–5.1)

## 2012-11-23 LAB — TSH: TSH: 0.939 u[IU]/mL (ref 0.350–4.500)

## 2012-11-23 NOTE — Progress Notes (Signed)
[id number] 

## 2012-11-23 NOTE — Progress Notes (Signed)
Dr. Janna Arch in facility. Notified him of patient's rash that is generalized, more pronounced on abdomen. Also notified doctor of patient sinus brady, lowest 38, held Digoxin this AM.

## 2012-11-23 NOTE — Progress Notes (Signed)
[name], Tabitha [name]              ACCOUNT NO.:  [id number]  MEDICAL RECORD NO.:  [id number]  LOCATION:  A334                          FACILITY:  [facility]  PHYSICIAN:  Melvyn Novas, MDDATE OF BIRTH:  11/22/1917  DATE OF PROCEDURE: DATE OF DISCHARGE:                                PROGRESS NOTE   SUBJECTIVE:  The patient is a 79 year old lady status post mitral valve repair 4 years ago, chronic atrial fibrillation admitted with increasing dyspnea, AFib with rapid ventricular response, elevated BNP. Yesterday, I added Lopressor 25 b.i.d. to decrease ventricular response, increase diastolic filling, and seems to be much less dyspneic today, sitting up, eating, in no respiratory distress.  PHYSICAL EXAMINATION:  VITAL SIGNS:  Blood pressure 150/94, temperature 97.7, pulse 74 and regular, respiratory rate is 18.  Rhythm has switched from AFib to aflutter with __________ conduction.  Creatinine 0.96, hemoglobin 12.9. LUNGS:  Diminished breath sounds at the bases.  No rales, wheeze, or rhonchi. HEART:  Irregularly regular.  No S3, no heaves, thrills, or rubs. EXTREMITIES:  Trace to 1+ pedal edema.  PLAN:  Right now is to continue current therapy.  Continue diuresis. Monitor renal function, electrolytes, and ventricular response.  We will make further recommendations as the database expands.     Melvyn Novas, MD     RMD/MEDQ  D:  11/22/2012  T:  11/23/2012  Job:  [id number]

## 2012-11-24 LAB — BASIC METABOLIC PANEL
BUN: 46 mg/dL — ABNORMAL HIGH (ref 6–23)
CO2: 37 mEq/L — ABNORMAL HIGH (ref 19–32)
Chloride: 98 mEq/L (ref 96–112)
GFR calc Af Amer: 63 mL/min — ABNORMAL LOW (ref >90)
Glucose, Bld: 102 mg/dL — ABNORMAL HIGH (ref 70–99)
Potassium: 3.6 mEq/L (ref 3.5–5.1)

## 2012-11-24 LAB — HEPATIC FUNCTION PANEL
ALT: 26 U/L (ref 0–35)
AST: 24 U/L (ref 0–37)
Alkaline Phosphatase: 54 U/L (ref 39–117)
Bilirubin, Direct: 0.1 mg/dL (ref 0.0–0.3)
Total Bilirubin: 0.5 mg/dL (ref 0.3–1.2)

## 2012-11-24 LAB — MAGNESIUM: Magnesium: 2.1 mg/dL (ref 1.5–2.5)

## 2012-11-24 MED ORDER — FUROSEMIDE 20 MG PO TABS: 20.0000 mg | ORAL_TABLET | Freq: Every day | ORAL | Status: DC

## 2012-11-24 MED ORDER — DIGOXIN 125 MCG PO TABS
0.1250 mg | ORAL_TABLET | Freq: Every day | ORAL | Status: DC
  Administered 2012-11-24: 0.125 mg via ORAL
  Filled 2012-11-24: qty 1

## 2012-11-24 MED ORDER — METOPROLOL TARTRATE 25 MG PO TABS: 25.0000 mg | ORAL_TABLET | Freq: Two times a day (BID) | ORAL | Status: AC

## 2012-11-24 MED ORDER — POTASSIUM CHLORIDE CRYS ER 10 MEQ PO TBCR: 10.0000 meq | EXTENDED_RELEASE_TABLET | Freq: Every day | ORAL | Status: AC

## 2012-11-24 MED ORDER — FUROSEMIDE 20 MG PO TABS
20.0000 mg | ORAL_TABLET | Freq: Every day | ORAL | Status: DC
  Administered 2012-11-24: 20 mg via ORAL
  Filled 2012-11-24: qty 1

## 2012-11-24 NOTE — Progress Notes (Signed)
UR chart review completed.  

## 2012-11-24 NOTE — Progress Notes (Signed)
Pt a/o.vss. Saline lock removed. No complaints of any distress. Discharge instructions and prescriptions discussed with caregiver at bedside. Caregiver verbalized understanding of instructions. Pt left floor via wheelchair with nursing staff and caregiver.

## 2012-11-24 NOTE — Progress Notes (Signed)
Subjective: She feels better and wants to go home. She has no new complaints.  Objective: Vital signs in last 24 hours: Temp:  [97.3 F (36.3 C)-97.4 F (36.3 C)] 97.3 F (36.3 C) (07/07 0601) Pulse Rate:  [52-79] 79 (07/07 0601) Resp:  [16-20] 16 (07/07 0601) BP: (120-143)/(73-80) 120/73 mmHg (07/07 0601) SpO2:  [92 %-100 %] 100 % (07/07 0601) Weight change:  Last BM Date: 11/22/12  Intake/Output from previous day: 07/06 0701 - 07/07 0700 In: 240 [P.O.:240] Out: 1050 [Urine:1050]  PHYSICAL EXAM General appearance: alert, cooperative and no distress Resp: rales bilaterally Cardio: irregularly irregular rhythm GI: soft, non-tender; bowel sounds normal; no masses,  no organomegaly Extremities: extremities normal, atraumatic, no cyanosis or edema  Lab Results:    Basic Metabolic Panel:  Recent Labs  95/28/41 0615 11/23/12 0651 11/24/12 0604  NA 142 140 143  K 3.5 3.6 3.6  CL 99 97 98  CO2 36* 37* 37*  GLUCOSE 103* 120* 102*  BUN 42* 45* 46*  CREATININE 0.96 0.95 0.89  CALCIUM 9.2 9.4 9.3  MG 2.0  --  2.1   Liver Function Tests:  Recent Labs  11/23/12 0651 11/24/12 0604  AST 22 24  ALT 21 26  ALKPHOS 51 54  BILITOT 0.3 0.5  PROT 6.7 6.9  ALBUMIN 3.1* 3.2*   No results found for this basename: LIPASE, AMYLASE,  in the last 72 hours No results found for this basename: AMMONIA,  in the last 72 hours CBC: No results found for this basename: WBC, NEUTROABS, HGB, HCT, MCV, PLT,  in the last 72 hours Cardiac Enzymes: No results found for this basename: CKTOTAL, CKMB, CKMBINDEX, TROPONINI,  in the last 72 hours BNP: No results found for this basename: PROBNP,  in the last 72 hours D-Dimer: No results found for this basename: DDIMER,  in the last 72 hours CBG: No results found for this basename: GLUCAP,  in the last 72 hours Hemoglobin A1C: No results found for this basename: HGBA1C,  in the last 72 hours Fasting Lipid Panel: No results found for this  basename: CHOL, HDL, LDLCALC, TRIG, CHOLHDL, LDLDIRECT,  in the last 72 hours Thyroid Function Tests:  Recent Labs  11/22/12 0615  TSH 0.939   Anemia Panel: No results found for this basename: VITAMINB12, FOLATE, FERRITIN, TIBC, IRON, RETICCTPCT,  in the last 72 hours Coagulation: No results found for this basename: LABPROT, INR,  in the last 72 hours Urine Drug Screen: Drugs of Abuse  No results found for this basename: labopia, cocainscrnur, labbenz, amphetmu, thcu, labbarb    Alcohol Level: No results found for this basename: ETH,  in the last 72 hours Urinalysis: No results found for this basename: COLORURINE, APPERANCEUR, LABSPEC, PHURINE, GLUCOSEU, HGBUR, BILIRUBINUR, KETONESUR, PROTEINUR, UROBILINOGEN, NITRITE, LEUKOCYTESUR,  in the last 72 hours Misc. Labs:  ABGS No results found for this basename: PHART, PCO2, PO2ART, TCO2, HCO3,  in the last 72 hours CULTURES No results found for this or any previous visit (from the past 240 hour(s)). Studies/Results: No results found.  Medications:  Prior to Admission:  Prescriptions prior to admission  Medication Sig Dispense Refill  . alendronate (FOSAMAX) 70 MG tablet Take 1 tablet by mouth once a week. Thursday morning      . azithromycin (ZITHROMAX) 250 MG tablet Take 250 mg by mouth daily. Takes 2 tablets on first day then 1 on days 2-5      . bimatoprost (LUMIGAN) 0.01 % SOLN Place 1 drop into both  eyes at bedtime.        . Brinzolamide-Brimonidine (SIMBRINZA) 1-0.2 % SUSP Place 1 drop into both eyes 3 (three) times daily.      . Calcium Carbonate-Vitamin D (CALCIUM 600 + D PO) Take 1 tablet by mouth 2 (two) times daily.      . clopidogrel (PLAVIX) 75 MG tablet Take 1 tablet (75 mg total) by mouth daily. Start on 11/15      . cyanocobalamin 2000 MCG tablet Take 2,000 mcg by mouth daily.      . digoxin (LANOXIN) 0.125 MG tablet Take 1 tablet (0.125 mg total) by mouth daily.  30 tablet  1  . dorzolamide (TRUSOPT) 2 %  ophthalmic solution Place 1 drop into both eyes 2 (two) times daily.      . megestrol (MEGACE) 40 MG/ML suspension Take 400 mg by mouth daily.      . midodrine (PROAMATINE) 5 MG tablet Take 5 mg by mouth 3 (three) times daily.      . predniSONE (DELTASONE) 5 MG tablet Take 15 mg by mouth daily.       Scheduled: . bimatoprost  1 drop Both Eyes QHS  . clopidogrel  75 mg Oral Q breakfast  . digoxin  0.25 mg Oral Daily  . dorzolamide  1 drop Both Eyes BID  . furosemide  20 mg Intravenous BID  . heparin  5,000 Units Subcutaneous Q8H  . metoprolol tartrate  25 mg Oral BID  . midodrine  5 mg Oral TID WC  . potassium chloride  10 mEq Oral Daily  . predniSONE  15 mg Oral Daily  . sodium chloride  3 mL Intravenous Q12H   Continuous:  WUJ:[id number]  Assesment: She was admitted with atrial fibrillation with rapid ventricular response. She was markedly dyspneic. She has pulmonary fibrosis. She has responded well to metoprolol and I think it's okay for her to take that because she does not have COPD she has pulmonary fibrosis. Active Problems:   Chronic diastolic heart failure   History of atrial fibrillation   Pulmonary fibrosis   Hypoxia   Chronic respiratory failure   Protein-calorie malnutrition, severe    Plan: Discharge home    LOS: 4 days   Tabitha [name] L 11/24/2012, 8:38 AM

## 2012-11-24 NOTE — Progress Notes (Signed)
Subjective: Patient feeling good, much better than last week.  Denies SOB or CP. Objective: Filed Vitals:   11/23/12 0835 11/23/12 1500 11/23/12 2143 11/24/12 0601  BP:  131/80 143/80 120/73  Pulse: 63 52 59 79  Temp:  97.4 F (36.3 C) 97.3 F (36.3 C) 97.3 F (36.3 C)  TempSrc:  Oral Oral Oral  Resp:  20 16 16   Height:      Weight:      SpO2: 93% 100% 92% 100%   Weight change:   Intake/Output Summary (Last 24 hours) at 11/24/12 0934 Last data filed at 11/24/12 0418  Gross per 24 hour  Intake    240 ml  Output   1050 ml  Net   -810 ml    General: Alert, awake, oriented x3, in no acute distress  Thin Neck:  JVP is normal Heart: Regular rate and rhythm, without murmurs, rubs, gallops.  Lungs: Fine rales at bases Exemities:  No edema.   Neuro: Grossly intact, nonfocal.  Tele:  Afib 60s/  Lab Results: Results for orders placed during the hospital encounter of 11/20/12 (from the past 24 hour(s))  DIGOXIN LEVEL     Status: None   Collection Time    11/24/12  6:04 AM      Result Value Range   Digoxin Level 1.5  0.8 - 2.0 ng/mL  BASIC METABOLIC PANEL     Status: Abnormal   Collection Time    11/24/12  6:04 AM      Result Value Range   Sodium 143  135 - 145 mEq/L   Potassium 3.6  3.5 - 5.1 mEq/L   Chloride 98  96 - 112 mEq/L   CO2 37 (*) 19 - 32 mEq/L   Glucose, Bld 102 (*) 70 - 99 mg/dL   BUN 46 (*) 6 - 23 mg/dL   Creatinine, Ser 1.30  0.50 - 1.10 mg/dL   Calcium 9.3  8.4 - 86.5 mg/dL   GFR calc non Af Amer 54 (*) >90 mL/min   GFR calc Af Amer 63 (*) >90 mL/min  HEPATIC FUNCTION PANEL     Status: Abnormal   Collection Time    11/24/12  6:04 AM      Result Value Range   Total Protein 6.9  6.0 - 8.3 g/dL   Albumin 3.2 (*) 3.5 - 5.2 g/dL   AST 24  0 - 37 U/L   ALT 26  0 - 35 U/L   Alkaline Phosphatase 54  39 - 117 U/L   Total Bilirubin 0.5  0.3 - 1.2 mg/dL   Bilirubin, Direct 0.1  0.0 - 0.3 mg/dL   Indirect Bilirubin 0.4  0.3 - 0.9 mg/dL  MAGNESIUM      Status: None   Collection Time    11/24/12  6:04 AM      Result Value Range   Magnesium 2.1  1.5 - 2.5 mg/dL    Studies/Results: @RISRSLT24 @  Medications:    @PROBHOSP @  1.  Atrial fibrillatoin.  Rates controlled.  Would decrease digoxin to 0.125 mg per day  Follow level and EKG as outpaitnen  Continue metoprolol  Keep on Plavix and aspirin  Not on anticoag due to frailty.  .    2.  CHF  Echo on Thursday showed mild to moderate LV dysfunction  May be related to rate   Volume status is improved.  With history of autonomic dysfunction reluctant to use lasix.  Would cut to 20 mg po 1x per  day     Follow exam, BP and labs.    Have made outpatient appt with    2.  MV  S/p MV repair,    3.  Pulmonary fibrosis  Followed by Dr. Juanetta Gosling  4.  Hypotension  Continue proamatine.and follow BP.  Follow up in clinic with Herma Carson PA on  7/30 at 11:20 AM [facility])  LOS: 4 days   Tabitha [name] 11/24/2012, 9:34 AM

## 2012-11-24 NOTE — Progress Notes (Signed)
NAMESHALITA, NOTTE              ACCOUNT NO.:  [id number]  MEDICAL RECORD NO.:  [id number]  LOCATION:  A334                          FACILITY:  [facility]  PHYSICIAN:  Melvyn Novas, MDDATE OF BIRTH:  1918-02-03  DATE OF PROCEDURE: DATE OF DISCHARGE:                                PROGRESS NOTE   SUBJECTIVE:  The patient __________ status post mitral valve repair, chronic atrial fibrillation, admitted with increasing dyspnea 3 days ago, AFib with rapid ventricular response, and elevated BNP.  Her heart rate ventricle response was brought down with the addition and increase of digoxin 0.25, likewise with the addition of Lopressor 25 p.o. b.i.d. Her ventricular response is now 60.  She denies any dizziness.  Her dyspnea is markedly improved with her increased diastolic filling period and the diminished ventricular response.  OBJECTIVE:  Blood pressure is 128/74, temperature 97.6, pulse 63 and regular, respiratory rate is 20, O2 sat is 93%.  Creatinine 0.94, potassium __________.  PLAN:  The plan right now is to obtain a 12 lead EKG to assess ventricular response tomorrow, BMP, as well as magnesium level, and she was given diuretics.  Continue to keep ventricular response low with varying drugs, digoxin and/or metoprolol as ordered.  Consider decreasing AV nodal blocking drugs if heart rate is too slow or symptomatic.  The patient is hemodynamically improved.     Melvyn Novas, MD     RMD/MEDQ  D:  11/23/2012  T:  11/23/2012  Job:  [id number]

## 2012-11-27 NOTE — Discharge Summary (Signed)
Physician Discharge Summary  Patient ID: Tabitha [name] MRN: [id number] DOB/AGE: [birth year] 79 y.o. Primary Care Physician:No primary provider on file. Admit date: 11/20/2012 Discharge date: 11/27/2012    Discharge Diagnoses:  Acute on chronic diastolic heart failure Atrial fibrillation with rapid ventricular response History of mitral valve repair Active Problems:   Chronic diastolic heart failure   History of atrial fibrillation   Pulmonary fibrosis   Hypoxia   Chronic respiratory failure   Protein-calorie malnutrition, severe     Medication List    STOP taking these medications       dorzolamide 2 % ophthalmic solution  Commonly known as:  TRUSOPT      TAKE these medications       alendronate 70 MG tablet  Commonly known as:  FOSAMAX  Take 1 tablet by mouth once a week. Thursday morning     azithromycin 250 MG tablet  Commonly known as:  ZITHROMAX  Take 250 mg by mouth daily. Takes 2 tablets on first day then 1 on days 2-5     bimatoprost 0.01 % Soln  Commonly known as:  LUMIGAN  Place 1 drop into both eyes at bedtime.     CALCIUM 600 + D PO  Take 1 tablet by mouth 2 (two) times daily.     clopidogrel 75 MG tablet  Commonly known as:  PLAVIX  Take 1 tablet (75 mg total) by mouth daily. Start on 11/15     cyanocobalamin 2000 MCG tablet  Take 2,000 mcg by mouth daily.     digoxin 0.125 MG tablet  Commonly known as:  LANOXIN  Take 1 tablet (0.125 mg total) by mouth daily.     furosemide 20 MG tablet  Commonly known as:  LASIX  Take 1 tablet (20 mg total) by mouth daily.     furosemide 20 MG tablet  Commonly known as:  LASIX  Take 1 tablet (20 mg total) by mouth daily.     megestrol 40 MG/ML suspension  Commonly known as:  MEGACE  Take 400 mg by mouth daily.     metoprolol tartrate 25 MG tablet  Commonly known as:  LOPRESSOR  Take 1 tablet (25 mg total) by mouth 2 (two) times daily.     midodrine 5 MG tablet  Commonly known as:  PROAMATINE   Take 5 mg by mouth 3 (three) times daily.     potassium chloride 10 MEQ tablet  Commonly known as:  K-DUR,KLOR-CON  Take 1 tablet (10 mEq total) by mouth daily.     predniSONE 5 MG tablet  Commonly known as:  DELTASONE  Take 15 mg by mouth daily.     SIMBRINZA 1-0.2 % Susp  Generic drug:  Brinzolamide-Brimonidine  Place 1 drop into both eyes 3 (three) times daily.        Discharged Condition: Improved    Consults: None  Significant Diagnostic Studies: Dg Chest 2 View  11/21/2012   *RADIOLOGY REPORT*  Clinical Data: CHF.  CHEST - 2 VIEW  Comparison: 11/20/2012  Findings: Status post median sternotomy CABG procedure.  Stable moderate cardiac enlargement. No pleural effusion identified. Advanced chronic interstitial lung disease is identified.  This is unchanged from previous exam.  No focal airspace consolidation identified.  The visualized osseous structures appear to be intact.  IMPRESSION:  1.  No significant change compared with previous exam. 2.  Advanced chronic interstitial lung disease.   Original Report Authenticated By: Signa Kell, M.D.   Dg Chest Port 1  View  11/20/2012   *RADIOLOGY REPORT*  Clinical Data: Short of breath.  History of pulmonary fibrosis. Atrial fibrillation.  PORTABLE CHEST - 1 VIEW  Comparison: 03/27/2012.  Apr 24, 2011.  11/11/2008.  Findings: There is been previous median sternotomy and mitral valve replacement.  The cardiac silhouette is enlarged.  The lungs show diffuse abnormal interstitial markings which are progressive over time.  There is considerable worsening since April 24, 2023 of last year, raising the question if this is simply due to progressive pulmonary fibrosis or if there is an element of superimposed congestive heart failure/interstitial edema.  No detectable effusion.  No focal consolidation or lobar collapse.  No acute bony finding.  IMPRESSION: Previous median sternotomy and mitral valve replacement.  Chronic cardiac enlargement.  Chronic  pulmonary fibrosis.  Worsening of diffuse interstitial pattern over time including pronounced worsening since 04/24/23 of last year.  Though this could be due to worsening of the pulmonary fibrosis, I think there is probably superimposed acute interstitial edema/congestive heart failure.   Original Report Authenticated By: Paulina Fusi, M.D.    Lab Results: Basic Metabolic Panel: No results found for this basename: NA, K, CL, CO2, GLUCOSE, BUN, CREATININE, CALCIUM, MG, PHOS,  in the last 72 hours Liver Function Tests: No results found for this basename: AST, ALT, ALKPHOS, BILITOT, PROT, ALBUMIN,  in the last 72 hours   CBC: No results found for this basename: WBC, NEUTROABS, HGB, HCT, MCV, PLT,  in the last 72 hours  No results found for this or any previous visit (from the past 240 hour(s)).   Hospital Course: She came in because of increasing shortness of breath. She was found to have atrial fibrillation with rapid ventricular response and what appeared to be acute on chronic diastolic heart failure. She was treated with Lasix and diltiazem and Lopressor and improved. She has a baseline of severe pulmonary fibrosis which was unchanged. Her heart rate came under control she had much less shortness of breath was able to angulate some in the room and was back to baseline at the time of discharge  Discharge Exam: Blood pressure 120/73, pulse 79, temperature 97.3 F (36.3 C), temperature source Oral, resp. rate 16, height 5\' 2"  (1.575 m), weight 39.508 kg (87 lb 1.6 oz), SpO2 100.00%. She is awake and alert. She is less dyspneic. Her chest shows chronic rales. She is still in atrial fibrillation  Disposition: Home with home health services      Discharge Orders   Future Appointments Provider Department Dept Phone   12/17/2012 11:20 AM Dyann Kief, PA-C [facility] [phone]   Future Orders Complete By Expires     Discharge patient  As directed     Face-to-face  encounter (required for Medicare/Medicaid patients)  As directed     Comments:      I Sherley Mckenney L certify that this patient is under my care and that I, or a nurse practitioner or physician's assistant working with me, had a face-to-face encounter that meets the physician face-to-face encounter requirements with this patient on 11/24/2012. The encounter with the patient was in whole, or in part for the following medical condition(s) which is the primary reason for home health care (List medical condition): chf/atrial fib    Questions:      The encounter with the patient was in whole, or in part, for the following medical condition, which is the primary reason for home health care:  chf/atrial fib    I certify that, based on my  findings, the following services are medically necessary home health services:  Nursing    Physical therapy    My clinical findings support the need for the above services:  Shortness of breath with activity    Further, I certify that my clinical findings support that this patient is homebound due to:  Shortness of Breath with activity    Reason for Medically Necessary Home Health Services:  Skilled Nursing- Change/Decline in Patient Status    Home Health  As directed     Questions:      To provide the following care/treatments:  PT    RN       Follow-up Information   Follow up with [facility] In 2 days. (they will call you)    Contact information:   [address] [place] [address] [phone]      Signed: Fredirick Maudlin Pager [phone]  11/27/2012, 8:04 AM

## 2012-12-17 ENCOUNTER — Encounter: Payer: Medicare Other | Admitting: Physician Assistant

## 2012-12-18 ENCOUNTER — Encounter: Payer: Medicare Other | Admitting: Adult Health
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
## 2012-12-30 ENCOUNTER — Ambulatory Visit (INDEPENDENT_AMBULATORY_CARE_PROVIDER_SITE_OTHER): Payer: Medicare Other | Admitting: Adult Health

## 2012-12-30 ENCOUNTER — Encounter: Payer: Self-pay | Admitting: Adult Health

## 2012-12-30 VITALS — BP 102/68 | HR 106 | Ht 63.5 in

## 2012-12-30 DIAGNOSIS — IMO0002 Reserved for concepts with insufficient information to code with codable children: Secondary | ICD-10-CM

## 2012-12-30 DIAGNOSIS — R001 Bradycardia, unspecified: Secondary | ICD-10-CM

## 2012-12-30 DIAGNOSIS — R82998 Other abnormal findings in urine: Secondary | ICD-10-CM

## 2012-12-30 DIAGNOSIS — J9610 Chronic respiratory failure, unspecified whether with hypoxia or hypercapnia: Secondary | ICD-10-CM

## 2012-12-30 DIAGNOSIS — J209 Acute bronchitis, unspecified: Secondary | ICD-10-CM

## 2012-12-30 DIAGNOSIS — I498 Other specified cardiac arrhythmias: Secondary | ICD-10-CM

## 2012-12-30 DIAGNOSIS — I5032 Chronic diastolic (congestive) heart failure: Secondary | ICD-10-CM

## 2012-12-30 DIAGNOSIS — I2510 Atherosclerotic heart disease of native coronary artery without angina pectoris: Secondary | ICD-10-CM

## 2012-12-30 DIAGNOSIS — I69922 Dysarthria following unspecified cerebrovascular disease: Secondary | ICD-10-CM

## 2012-12-30 DIAGNOSIS — R8281 Pyuria: Secondary | ICD-10-CM

## 2012-12-30 DIAGNOSIS — I080 Rheumatic disorders of both mitral and aortic valves: Secondary | ICD-10-CM

## 2012-12-30 LAB — CBC
HCT: 41.5 % (ref 36.0–46.0)
Platelets: 320 10*3/uL (ref 150–400)
RDW: 15.5 % (ref 11.5–15.5)
WBC: 13.7 10*3/uL — ABNORMAL HIGH (ref 4.0–10.5)

## 2012-12-30 MED ORDER — DIGOXIN 125 MCG PO TABS: 0.2500 mg | ORAL_TABLET | Freq: Every day | ORAL | Status: AC

## 2012-12-30 NOTE — Assessment & Plan Note (Signed)
Remains in atrial fibrillation with HR slightly elevated. She is tachypneac. O2 Sat is 91%. I am concerned about mild dehydration, although her caregiver states that she eats and drinks very well. Her wt is the same as when she was discharged. I will increase digoxin to .25mg  daily. She will have a BMET and CBC completed with a UA.
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
## 2012-12-30 NOTE — Assessment & Plan Note (Signed)
No evidence of decompensation despite ongoing dyspnea. Will check BMET for evidence of dehydration.

## 2012-12-30 NOTE — Progress Notes (Deleted)
Name: Tabitha [name]    DOB: August 30, 1917  Age: 79 y.o.  MR#: [id number]       PCP:  Fredirick Maudlin, MD      Insurance: Payor: MEDICARE / Plan: MEDICARE PART A AND B / Product Type: *No Product type* /   CC:    Chief Complaint  Patient presents with  . Coronary Artery Disease  . Atrial Fibrillation  . Congestive Heart Failure    Diastolic    VS Filed Vitals:   12/30/12 1437  BP: 102/68  Pulse: 106  Height: 5' 3.5" (1.613 m)    Weights Current Weight  11/20/12 87 lb 1.6 oz (39.508 kg)  10/21/12 91 lb (41.277 kg)  03/27/12 107 lb 12.9 oz (48.9 kg)    Blood Pressure  BP Readings from Last 3 Encounters:  12/30/12 102/68  11/24/12 120/73  10/21/12 112/60     Admit date:  (Not on file) Last encounter with RMR:  Visit date not found   Allergy Penicillins  Current Outpatient Prescriptions  Medication Sig Dispense Refill  . alendronate (FOSAMAX) 70 MG tablet Take 1 tablet by mouth once a week. Thursday morning      . bimatoprost (LUMIGAN) 0.01 % SOLN Place 1 drop into both eyes at bedtime.        . Brinzolamide-Brimonidine (SIMBRINZA) 1-0.2 % SUSP Place 1 drop into both eyes 3 (three) times daily.      . Calcium Carbonate-Vitamin D (CALCIUM 600 + D PO) Take 1 tablet by mouth 2 (two) times daily.      . clopidogrel (PLAVIX) 75 MG tablet Take 1 tablet (75 mg total) by mouth daily. Start on 11/15      . cyanocobalamin 2000 MCG tablet Take 2,000 mcg by mouth daily.      . digoxin (LANOXIN) 0.125 MG tablet Take 1 tablet (0.125 mg total) by mouth daily.  30 tablet  1  . megestrol (MEGACE) 40 MG/ML suspension Take 400 mg by mouth daily.      . metoprolol tartrate (LOPRESSOR) 25 MG tablet Take 1 tablet (25 mg total) by mouth 2 (two) times daily.  60 tablet  12  . midodrine (PROAMATINE) 5 MG tablet Take 5 mg by mouth 3 (three) times daily.      . potassium chloride (K-DUR,KLOR-CON) 10 MEQ tablet Take 1 tablet (10 mEq total) by mouth daily.  30 tablet  12  . predniSONE (DELTASONE) 5  MG tablet Take 15 mg by mouth daily.       No current facility-administered medications for this visit.    Discontinued Meds:    Medications Discontinued During This Encounter  Medication Reason  . azithromycin (ZITHROMAX) 250 MG tablet Error  . furosemide (LASIX) 20 MG tablet Error  . furosemide (LASIX) 20 MG tablet Error    Patient Active Problem List   Diagnosis Date Noted  . Protein-calorie malnutrition, severe 11/20/2012  . Pelvic fracture 03/27/2012  . Chronic respiratory failure 03/27/2012  . Hypoxia 04/02/2011  . Dyslipidemia, goal LDL below 100 04/01/2011  . Stroke, acute, thrombotic 03/31/2011  . Dysarthria due to cerebrovascular accident 03/31/2011  . Right hemiparesis 03/31/2011  . Bradycardia 03/31/2011  . Pyuria 03/31/2011  . Glaucoma 03/31/2011  . History of atrial fibrillation 03/31/2011  . Traumatic ecchymosis of lower leg 03/31/2011  . Cellulitis of left hand 03/31/2011  . Pulmonary fibrosis 03/31/2011  . MITRAL REGURGITATION, 0 (MILD) 07/15/2009  . CAD, NATIVE VESSEL 07/15/2009  . Chronic diastolic heart failure 07/15/2009  .  GLAUCOMA 12/05/2007  . ACUTE BRONCHITIS 12/05/2007  . OSTEOPOROSIS 12/05/2007    LABS    Component Value Date/Time   NA 143 11/24/2012 0604   NA 140 11/23/2012 0651   NA 142 11/22/2012 0615   K 3.6 11/24/2012 0604   K 3.6 11/23/2012 0651   K 3.5 11/22/2012 0615   CL 98 11/24/2012 0604   CL 97 11/23/2012 0651   CL 99 11/22/2012 0615   CO2 37* 11/24/2012 0604   CO2 37* 11/23/2012 0651   CO2 36* 11/22/2012 0615   GLUCOSE 102* 11/24/2012 0604   GLUCOSE 120* 11/23/2012 0651   GLUCOSE 103* 11/22/2012 0615   BUN 46* 11/24/2012 0604   BUN 45* 11/23/2012 0651   BUN 42* 11/22/2012 0615   CREATININE 0.89 11/24/2012 0604   CREATININE 0.95 11/23/2012 0651   CREATININE 0.96 11/22/2012 0615   CREATININE 0.84 07/28/2010 1639   CALCIUM 9.3 11/24/2012 0604   CALCIUM 9.4 11/23/2012 0651   CALCIUM 9.2 11/22/2012 0615   GFRNONAA 54* 11/24/2012 0604   GFRNONAA 50* 11/23/2012 0651    GFRNONAA 49* 11/22/2012 0615   GFRAA 63* 11/24/2012 0604   GFRAA 58* 11/23/2012 0651   GFRAA 57* 11/22/2012 0615   CMP     Component Value Date/Time   NA 143 11/24/2012 0604   K 3.6 11/24/2012 0604   CL 98 11/24/2012 0604   CO2 37* 11/24/2012 0604   GLUCOSE 102* 11/24/2012 0604   BUN 46* 11/24/2012 0604   CREATININE 0.89 11/24/2012 0604   CREATININE 0.84 07/28/2010 1639   CALCIUM 9.3 11/24/2012 0604   PROT 6.9 11/24/2012 0604   ALBUMIN 3.2* 11/24/2012 0604   AST 24 11/24/2012 0604   ALT 26 11/24/2012 0604   ALKPHOS 54 11/24/2012 0604   BILITOT 0.5 11/24/2012 0604   GFRNONAA 54* 11/24/2012 0604   GFRAA 63* 11/24/2012 0604       Component Value Date/Time   WBC 12.9* 11/20/2012 1031   WBC 10.9* 03/29/2012 0645   WBC 9.0 03/28/2012 0536   HGB 14.3 11/20/2012 1031   HGB 11.1* 03/29/2012 0645   HGB 10.9* 03/28/2012 0536   HCT 44.2 11/20/2012 1031   HCT 33.6* 03/29/2012 0645   HCT 34.1* 03/28/2012 0536   MCV 92.1 11/20/2012 1031   MCV 94.1 03/29/2012 0645   MCV 94.5 03/28/2012 0536    Lipid Panel     Component Value Date/Time   CHOL 165 04/01/2011 0443   TRIG 66 04/01/2011 0443   HDL 52 04/01/2011 0443   CHOLHDL 3.2 04/01/2011 0443   VLDL 13 04/01/2011 0443   LDLCALC 100* 04/01/2011 0443    ABG    Component Value Date/Time   PHART 7.428* 12/24/2008 1030   PCO2ART 41.8 12/24/2008 1030   PO2ART 88.1 12/24/2008 1030   HCO3 27.1* 12/24/2008 1030   TCO2 23.9 12/24/2008 1030   ACIDBASEDEF 4.0* 04/04/2007 1905   O2SAT 97.2 12/24/2008 1030     Lab Results  Component Value Date   TSH 0.939 11/22/2012   BNP (last 3 results)  Recent Labs  11/20/12 1031  PROBNP 7386.0*   Cardiac Panel (last 3 results) No results found for this basename: CKTOTAL, CKMB, TROPONINI, RELINDX,  in the last 72 hours  Iron/TIBC/Ferritin No results found for this basename: iron, tibc, ferritin     EKG Orders placed in visit on 12/30/12  . EKG 12-LEAD     Prior Assessment and Plan Problem List as of 12/30/2012     Cardiovascular and Mediastinum  MITRAL REGURGITATION, 0 (MILD)   Last Assessment & Plan   05/02/2011 Office Visit Written 05/02/2011 11:14 AM by Gaylord Shih, MD     Stable. Patient reassured that no indication for surgery nor will ever be    CAD, NATIVE VESSEL   Last Assessment & Plan   05/02/2011 Office Visit Written 05/02/2011 11:13 AM by Gaylord Shih, MD     Stable. Continue medical therapy.    Chronic diastolic heart failure   Last Assessment & Plan   10/21/2012 Office Visit Written 10/21/2012 12:07 PM by Gaylord Shih, MD     Stable. No change in current medical management.    Stroke, acute, thrombotic   Last Assessment & Plan   10/21/2012 Office Visit Written 10/21/2012 12:08 PM by Gaylord Shih, MD     No recurrence. Because of falls, not a candidate for anticoagulation.      Respiratory   Pulmonary fibrosis   Hypoxia   ACUTE BRONCHITIS   Chronic respiratory failure     Nervous and Auditory   Right hemiparesis     Musculoskeletal and Integument   OSTEOPOROSIS   Pelvic fracture     Other   Glaucoma   Traumatic ecchymosis of lower leg   GLAUCOMA   Dysarthria due to cerebrovascular accident   Bradycardia   Last Assessment & Plan   05/02/2011 Office Visit Written 05/02/2011 11:13 AM by Gaylord Shih, MD     Stable. Asymptomatic. No change in treatment.    Pyuria   History of atrial fibrillation   Last Assessment & Plan   05/02/2011 Office Visit Written 05/02/2011 11:23 AM by Gaylord Shih, MD     By exam, she may be in atrial fib. She is asymptomatic and rate is well controlled. Continue Plavix. She is not Coumadin candidate.    Cellulitis of left hand   Dyslipidemia, goal LDL below 100   Protein-calorie malnutrition, severe       Imaging: No results found.

## 2012-12-30 NOTE — Patient Instructions (Addendum)
Your physician recommends that you schedule a follow-up appointment in: 1 week    Your physician recommends that you return for lab work in today. BMET, CBC, UA  Your physician has recommended you make the following change in your medication:  1. Increase Digoxin 0.25 mg daily

## 2012-12-30 NOTE — Assessment & Plan Note (Signed)
She short of breath but is at baseline for her. Continue home O2.

## 2012-12-31 LAB — BASIC METABOLIC PANEL
Chloride: 97 mEq/L (ref 96–112)
Potassium: 5.8 mEq/L — ABNORMAL HIGH (ref 3.5–5.3)
Sodium: 142 mEq/L (ref 135–145)

## 2013-01-01 ENCOUNTER — Other Ambulatory Visit: Payer: Self-pay | Admitting: *Deleted

## 2013-01-01 DIAGNOSIS — Z8679 Personal history of other diseases of the circulatory system: Secondary | ICD-10-CM

## 2013-01-01 DIAGNOSIS — R001 Bradycardia, unspecified: Secondary | ICD-10-CM

## 2013-01-01 DIAGNOSIS — R8281 Pyuria: Secondary | ICD-10-CM

## 2013-01-02 LAB — URINALYSIS
Bilirubin Urine: NEGATIVE
Glucose, UA: NEGATIVE mg/dL
Specific Gravity, Urine: 1.014 (ref 1.005–1.030)

## 2013-01-06 ENCOUNTER — Encounter: Payer: Self-pay | Admitting: *Deleted

## 2013-01-09 ENCOUNTER — Ambulatory Visit: Payer: Medicare Other | Admitting: Adult Health

## 2013-01-23 ENCOUNTER — Encounter: Payer: Medicare Other | Admitting: Adult Health

## 2013-01-23 ENCOUNTER — Encounter: Payer: Self-pay | Admitting: Adult Health
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
## 2013-02-12 ENCOUNTER — Ambulatory Visit (INDEPENDENT_AMBULATORY_CARE_PROVIDER_SITE_OTHER): Payer: Medicare Other | Admitting: Otolaryngology

## 2013-02-18 DEATH — deceased

## 2013-10-16 NOTE — Progress Notes (Signed)
This encounter was created in error - please disregard.

## 2014-08-28 IMAGING — CR DG CHEST 2V
2 series · 2 of 2 positions shown · non-contrast
Comparison: 11/20/2012

CLINICAL DATA: CHF.

CHEST - 2 VIEW

[pa]
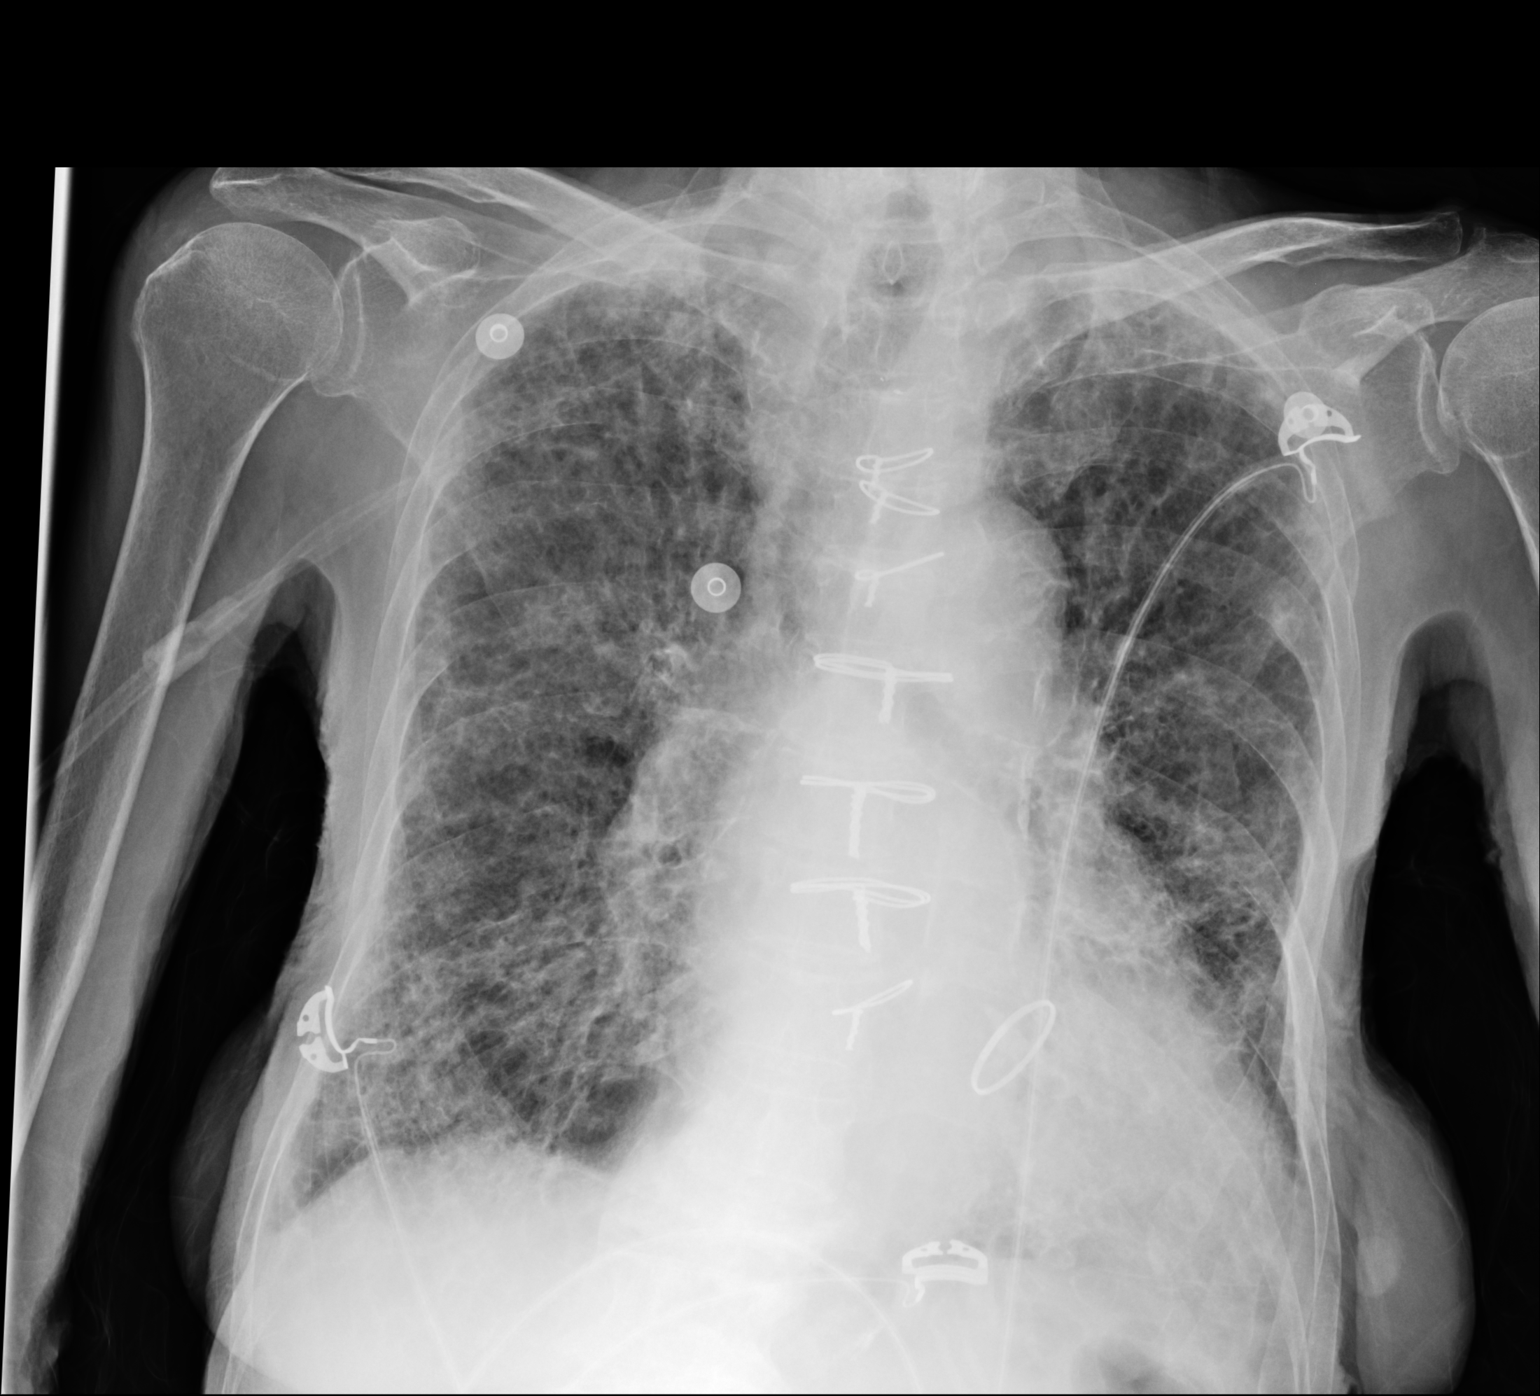

[lat]
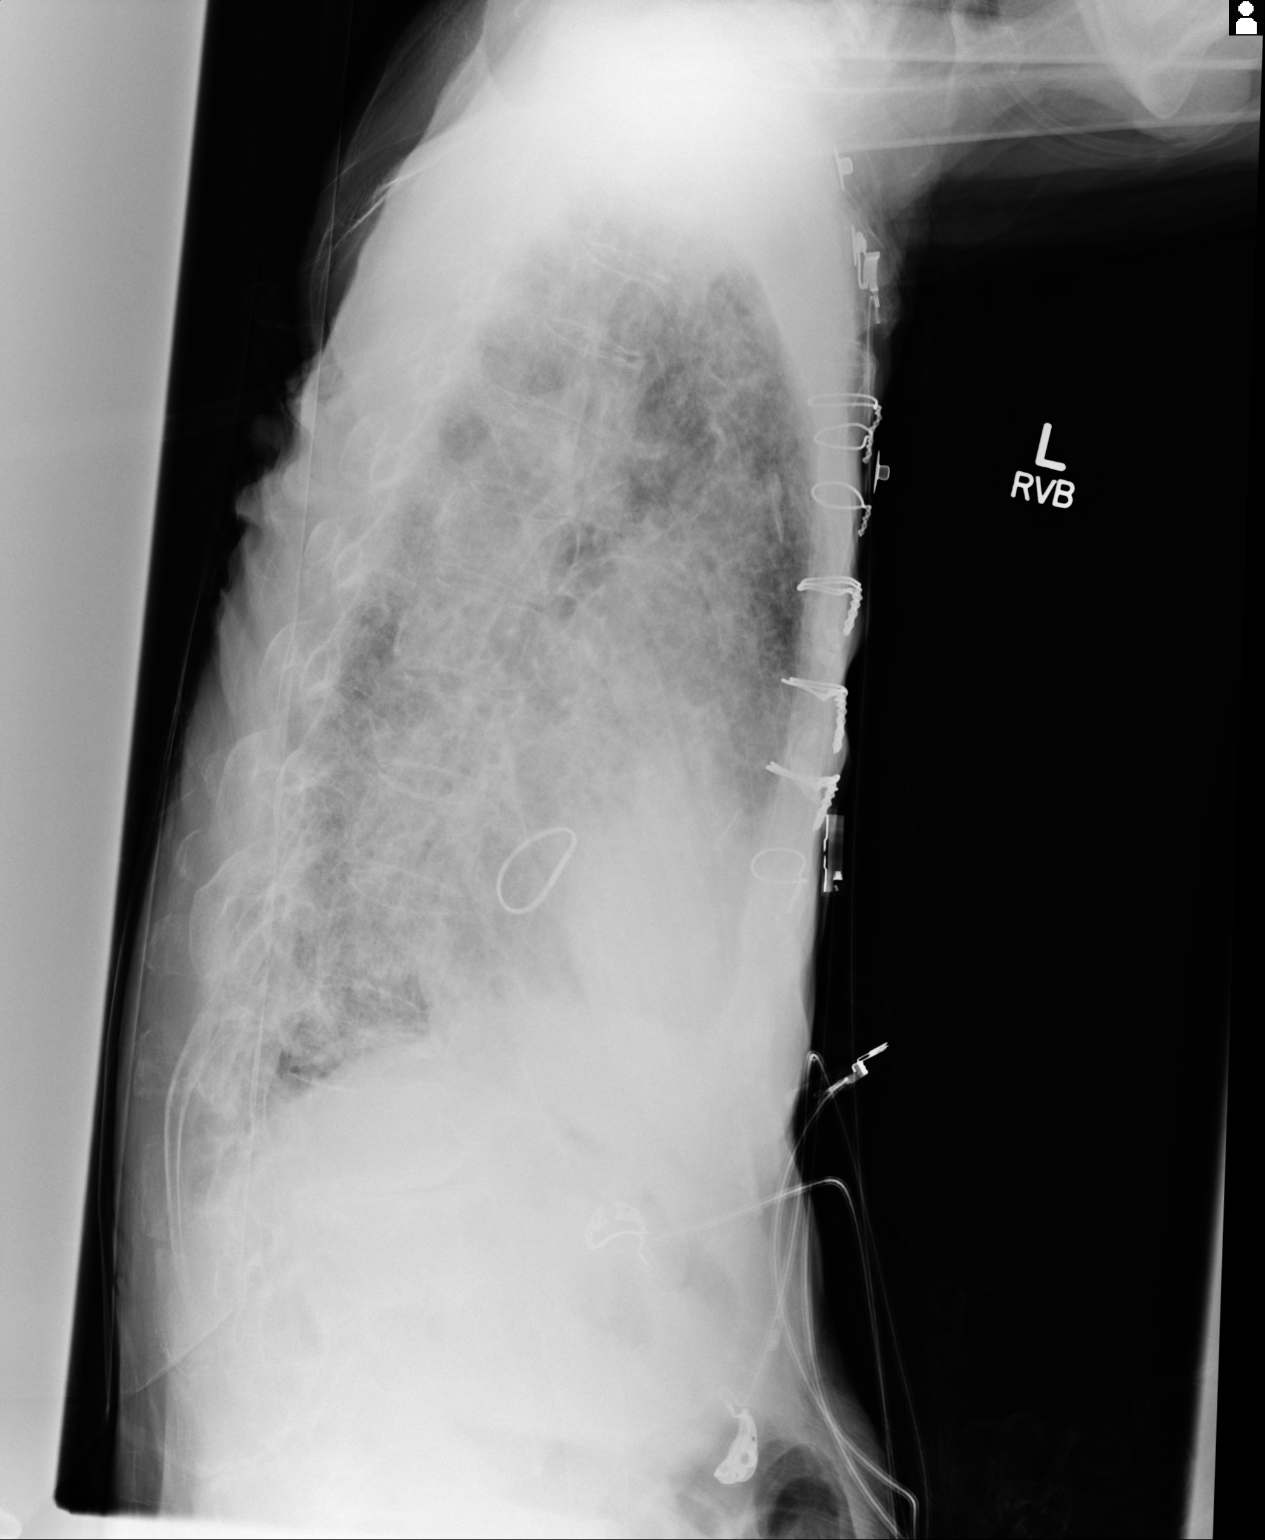

[2 of 2 positions shown; findings below may reference images not displayed]

FINDINGS: Status post median sternotomy CABG procedure.  Stable
moderate cardiac enlargement. No pleural effusion identified.
Advanced chronic interstitial lung disease is identified.  This is
unchanged from previous exam.  No focal airspace consolidation
identified.  The visualized osseous structures appear to be intact.
IMPRESSION: 1.  No significant change compared with previous exam.
2.  Advanced chronic interstitial lung disease.
